# Patient Record
Sex: Female | Born: 1950 | Race: White | Hispanic: No | Marital: Married | Smoking: Former smoker
Health system: Southern US, Community
[De-identification: ages and names within clinical notes are randomized; demographics above are authoritative.]

## PROBLEM LIST (undated history)

## (undated) DIAGNOSIS — G8929 Other chronic pain: Secondary | ICD-10-CM

## (undated) DIAGNOSIS — R519 Headache, unspecified: Secondary | ICD-10-CM

## (undated) DIAGNOSIS — E785 Hyperlipidemia, unspecified: Secondary | ICD-10-CM

## (undated) DIAGNOSIS — F419 Anxiety disorder, unspecified: Secondary | ICD-10-CM

## (undated) DIAGNOSIS — G500 Trigeminal neuralgia: Secondary | ICD-10-CM

## (undated) DIAGNOSIS — K219 Gastro-esophageal reflux disease without esophagitis: Secondary | ICD-10-CM

## (undated) DIAGNOSIS — U071 COVID-19: Secondary | ICD-10-CM

## (undated) DIAGNOSIS — K2970 Gastritis, unspecified, without bleeding: Secondary | ICD-10-CM

## (undated) DIAGNOSIS — F32A Depression, unspecified: Secondary | ICD-10-CM

## (undated) DIAGNOSIS — M549 Dorsalgia, unspecified: Secondary | ICD-10-CM

## (undated) DIAGNOSIS — F329 Major depressive disorder, single episode, unspecified: Secondary | ICD-10-CM

## (undated) HISTORY — DX: Headache, unspecified: R51.9

## (undated) HISTORY — PX: BACK SURGERY: SHX140

## (undated) HISTORY — PX: OTHER SURGICAL HISTORY: SHX169

## (undated) HISTORY — DX: Headache, unspecified: G89.29

## (undated) HISTORY — PX: BUNIONECTOMY: SHX129

## (undated) HISTORY — DX: COVID-19: U07.1

## (undated) HISTORY — DX: Trigeminal neuralgia: G50.0

## (undated) HISTORY — PX: ABDOMINAL HYSTERECTOMY: SHX81

## (undated) HISTORY — PX: CATARACT EXTRACTION: SUR2

## (undated) HISTORY — DX: Gastro-esophageal reflux disease without esophagitis: K21.9

---

## 2015-03-24 ENCOUNTER — Other Ambulatory Visit: Payer: Self-pay | Admitting: Neurosurgery

## 2015-03-24 DIAGNOSIS — M5126 Other intervertebral disc displacement, lumbar region: Secondary | ICD-10-CM

## 2015-03-27 ENCOUNTER — Ambulatory Visit
Admission: RE | Admit: 2015-03-27 | Discharge: 2015-03-27 | Disposition: A | Discharge status: Home / Self Care | Payer: Managed Care, Other (non HMO) | Source: Ambulatory Visit | Attending: Neurosurgery | Admitting: Neurosurgery

## 2015-03-27 DIAGNOSIS — M5126 Other intervertebral disc displacement, lumbar region: Secondary | ICD-10-CM

## 2015-03-27 MED ORDER — GADOBENATE DIMEGLUMINE 529 MG/ML IV SOLN
15.0000 mL | Freq: Once | INTRAVENOUS | Status: AC | PRN
  Administered 2015-03-27: 15 mL via INTRAVENOUS

## 2015-03-28 ENCOUNTER — Other Ambulatory Visit: Payer: Self-pay

## 2015-04-17 ENCOUNTER — Other Ambulatory Visit: Payer: Self-pay | Admitting: Neurosurgery

## 2015-04-17 ENCOUNTER — Other Ambulatory Visit (HOSPITAL_COMMUNITY): Payer: Self-pay | Admitting: Neurosurgery

## 2015-04-17 DIAGNOSIS — M5126 Other intervertebral disc displacement, lumbar region: Secondary | ICD-10-CM

## 2015-05-03 ENCOUNTER — Ambulatory Visit (HOSPITAL_COMMUNITY): Payer: Managed Care, Other (non HMO)

## 2015-05-09 ENCOUNTER — Other Ambulatory Visit: Payer: Self-pay | Admitting: Neurosurgery

## 2015-05-09 DIAGNOSIS — M79605 Pain in left leg: Principal | ICD-10-CM

## 2015-05-09 DIAGNOSIS — M79604 Pain in right leg: Secondary | ICD-10-CM

## 2015-05-10 ENCOUNTER — Ambulatory Visit: Payer: Managed Care, Other (non HMO)

## 2015-05-10 DIAGNOSIS — M79604 Pain in right leg: Secondary | ICD-10-CM

## 2015-05-10 DIAGNOSIS — M79605 Pain in left leg: Principal | ICD-10-CM

## 2015-05-19 ENCOUNTER — Other Ambulatory Visit: Payer: Self-pay | Admitting: Pediatrics

## 2015-05-19 ENCOUNTER — Other Ambulatory Visit: Payer: Self-pay | Admitting: Neurosurgery

## 2015-05-19 DIAGNOSIS — M5126 Other intervertebral disc displacement, lumbar region: Secondary | ICD-10-CM

## 2015-05-29 ENCOUNTER — Ambulatory Visit
Admission: RE | Admit: 2015-05-29 | Discharge: 2015-05-29 | Disposition: A | Discharge status: Home / Self Care | Payer: Managed Care, Other (non HMO) | Source: Ambulatory Visit | Attending: Neurosurgery | Admitting: Neurosurgery

## 2015-05-29 DIAGNOSIS — M5126 Other intervertebral disc displacement, lumbar region: Secondary | ICD-10-CM

## 2015-05-29 MED ORDER — DIAZEPAM 5 MG PO TABS
10.0000 mg | ORAL_TABLET | Freq: Once | ORAL | Status: AC
  Administered 2015-05-29: 10 mg via ORAL

## 2015-05-29 MED ORDER — IOHEXOL 180 MG/ML  SOLN
15.0000 mL | Freq: Once | INTRAMUSCULAR | Status: AC | PRN
  Administered 2015-05-29: 15 mL via INTRATHECAL

## 2015-05-29 NOTE — Discharge Instructions (Signed)
Myelogram Discharge Instructions  1. Go home and rest quietly for the next 24 hours.  It is important to lie flat for the next 24 hours.  Get up only to go to the restroom.  You may lie in the bed or on a couch on your back, your stomach, your left side or your right side.  You may have one pillow under your head.  You may have pillows between your knees while you are on your side or under your knees while you are on your back.  2. DO NOT drive today.  Recline the seat as far back as it will go, while still wearing your seat belt, on the way home.  3. You may get up to go to the bathroom as needed.  You may sit up for 10 minutes to eat.  You may resume your normal diet and medications unless otherwise indicated.  Drink plenty of extra fluids today and tomorrow.  4. The incidence of a spinal headache with nausea and/or vomiting is about 5% (one in 20 patients).  If you develop a headache, lie flat and drink plenty of fluids until the headache goes away.  Caffeinated beverages may be helpful.  If you develop severe nausea and vomiting or a headache that does not go away with flat bed rest, call [phone].  5. You may resume normal activities after your 24 hours of bed rest is over; however, do not exert yourself strongly or do any heavy lifting tomorrow.  6. Call your physician for a follow-up appointment.   You may resume Fluoxetine on Tuesday, May 30, 2015 after 11:00a.m.

## 2015-06-07 ENCOUNTER — Other Ambulatory Visit: Payer: Self-pay | Admitting: Neurosurgery

## 2015-06-09 ENCOUNTER — Encounter (HOSPITAL_COMMUNITY): Payer: Self-pay | Admitting: *Deleted

## 2015-06-09 NOTE — Progress Notes (Signed)
Pt denies cardiac history, chest pain or sob. 

## 2015-06-12 ENCOUNTER — Ambulatory Visit (HOSPITAL_COMMUNITY): Payer: Managed Care, Other (non HMO)

## 2015-06-12 ENCOUNTER — Ambulatory Visit (HOSPITAL_COMMUNITY)
Admission: RE | Admit: 2015-06-12 | Discharge: 2015-06-12 | Disposition: A | Discharge status: Home / Self Care | Payer: Managed Care, Other (non HMO) | Source: Ambulatory Visit | Attending: Neurosurgery | Admitting: Neurosurgery

## 2015-06-12 ENCOUNTER — Ambulatory Visit (HOSPITAL_COMMUNITY): Payer: Managed Care, Other (non HMO) | Admitting: Certified Registered"

## 2015-06-12 ENCOUNTER — Encounter (HOSPITAL_COMMUNITY): Payer: Self-pay | Admitting: Surgery

## 2015-06-12 ENCOUNTER — Encounter (HOSPITAL_COMMUNITY)
Admission: RE | Disposition: A | Discharge status: Home / Self Care | Payer: Self-pay | Source: Ambulatory Visit | Attending: Neurosurgery

## 2015-06-12 DIAGNOSIS — Z87891 Personal history of nicotine dependence: Secondary | ICD-10-CM | POA: Insufficient documentation

## 2015-06-12 DIAGNOSIS — E785 Hyperlipidemia, unspecified: Secondary | ICD-10-CM | POA: Diagnosis not present

## 2015-06-12 DIAGNOSIS — M5126 Other intervertebral disc displacement, lumbar region: Secondary | ICD-10-CM | POA: Diagnosis not present

## 2015-06-12 DIAGNOSIS — M549 Dorsalgia, unspecified: Secondary | ICD-10-CM

## 2015-06-12 HISTORY — PX: LUMBAR LAMINECTOMY/DECOMPRESSION MICRODISCECTOMY: SHX5026

## 2015-06-12 HISTORY — DX: Anxiety disorder, unspecified: F41.9

## 2015-06-12 HISTORY — DX: Hyperlipidemia, unspecified: E78.5

## 2015-06-12 HISTORY — DX: Depression, unspecified: F32.A

## 2015-06-12 HISTORY — DX: Dorsalgia, unspecified: M54.9

## 2015-06-12 HISTORY — DX: Major depressive disorder, single episode, unspecified: F32.9

## 2015-06-12 HISTORY — DX: Other chronic pain: G89.29

## 2015-06-12 LAB — CBC
HEMATOCRIT: 38.2 % (ref 36.0–46.0)
Hemoglobin: 13.1 g/dL (ref 12.0–15.0)
MCH: 34.1 pg — AB (ref 26.0–34.0)
MCHC: 34.3 g/dL (ref 30.0–36.0)
MCV: 99.5 fL (ref 78.0–100.0)
Platelets: 212 10*3/uL (ref 150–400)
RBC: 3.84 MIL/uL — AB (ref 3.87–5.11)
RDW: 12.5 % (ref 11.5–15.5)
WBC: 5.7 10*3/uL (ref 4.0–10.5)

## 2015-06-12 LAB — SURGICAL PCR SCREEN
MRSA, PCR: NEGATIVE
STAPHYLOCOCCUS AUREUS: NEGATIVE

## 2015-06-12 SURGERY — LUMBAR LAMINECTOMY/DECOMPRESSION MICRODISCECTOMY 1 LEVEL
Anesthesia: General | Site: Back

## 2015-06-12 MED ORDER — ACETAMINOPHEN 10 MG/ML IV SOLN
INTRAVENOUS | Status: AC
  Administered 2015-06-12: 1000 mg via INTRAVENOUS
  Filled 2015-06-12: qty 100

## 2015-06-12 MED ORDER — METHYLPREDNISOLONE ACETATE 80 MG/ML IJ SUSP
INTRAMUSCULAR | Status: DC | PRN
  Administered 2015-06-12: 80 mg

## 2015-06-12 MED ORDER — BUPIVACAINE HCL 0.5 % IJ SOLN
INTRAMUSCULAR | Status: DC | PRN
  Administered 2015-06-12: 20 mL

## 2015-06-12 MED ORDER — LACTATED RINGERS IV SOLN
INTRAVENOUS | Status: DC
  Administered 2015-06-12 (×3): via INTRAVENOUS

## 2015-06-12 MED ORDER — MIDAZOLAM HCL 2 MG/2ML IJ SOLN
INTRAMUSCULAR | Status: AC
  Filled 2015-06-12: qty 4

## 2015-06-12 MED ORDER — FENTANYL CITRATE (PF) 250 MCG/5ML IJ SOLN
INTRAMUSCULAR | Status: AC
  Filled 2015-06-12: qty 5

## 2015-06-12 MED ORDER — CEFAZOLIN SODIUM 1-5 GM-% IV SOLN
1.0000 g | Freq: Three times a day (TID) | INTRAVENOUS | Status: DC
  Filled 2015-06-12 (×2): qty 50

## 2015-06-12 MED ORDER — HEMOSTATIC AGENTS (NO CHARGE) OPTIME
TOPICAL | Status: DC | PRN
  Administered 2015-06-12: 1 via TOPICAL

## 2015-06-12 MED ORDER — OXYCODONE-ACETAMINOPHEN 5-325 MG PO TABS: 1.0000 | ORAL_TABLET | ORAL | Status: DC | PRN

## 2015-06-12 MED ORDER — KETOROLAC TROMETHAMINE 30 MG/ML IJ SOLN: 30.0000 mg | Freq: Once | INTRAMUSCULAR | Status: DC | PRN

## 2015-06-12 MED ORDER — FENTANYL CITRATE (PF) 100 MCG/2ML IJ SOLN
INTRAMUSCULAR | Status: DC | PRN
  Administered 2015-06-12 (×4): 50 ug via INTRAVENOUS

## 2015-06-12 MED ORDER — DEXAMETHASONE SODIUM PHOSPHATE 10 MG/ML IJ SOLN
INTRAMUSCULAR | Status: DC | PRN
  Administered 2015-06-12: 10 mg via INTRAVENOUS

## 2015-06-12 MED ORDER — SUGAMMADEX SODIUM 200 MG/2ML IV SOLN
INTRAVENOUS | Status: DC | PRN
  Administered 2015-06-12: 160 mg via INTRAVENOUS

## 2015-06-12 MED ORDER — ACETAMINOPHEN 650 MG RE SUPP: 650.0000 mg | RECTAL | Status: DC | PRN

## 2015-06-12 MED ORDER — PROMETHAZINE HCL 25 MG/ML IJ SOLN: 6.2500 mg | INTRAMUSCULAR | Status: DC | PRN

## 2015-06-12 MED ORDER — HYDROMORPHONE HCL 1 MG/ML IJ SOLN: 0.2500 mg | INTRAMUSCULAR | Status: DC | PRN

## 2015-06-12 MED ORDER — SODIUM CHLORIDE 0.9 % IV SOLN: 250.0000 mL | INTRAVENOUS | Status: DC

## 2015-06-12 MED ORDER — KETOROLAC TROMETHAMINE 30 MG/ML IJ SOLN
30.0000 mg | Freq: Four times a day (QID) | INTRAMUSCULAR | Status: DC
  Filled 2015-06-12: qty 1

## 2015-06-12 MED ORDER — LIDOCAINE-EPINEPHRINE 0.5 %-1:200000 IJ SOLN
INTRAMUSCULAR | Status: DC | PRN
  Administered 2015-06-12: 10 mL

## 2015-06-12 MED ORDER — CEFAZOLIN SODIUM-DEXTROSE 2-3 GM-% IV SOLR
INTRAVENOUS | Status: AC
  Filled 2015-06-12: qty 50

## 2015-06-12 MED ORDER — SODIUM CHLORIDE 0.9 % IJ SOLN: 3.0000 mL | Freq: Two times a day (BID) | INTRAMUSCULAR | Status: DC

## 2015-06-12 MED ORDER — PHENYLEPHRINE HCL 10 MG/ML IJ SOLN
INTRAMUSCULAR | Status: DC | PRN
  Administered 2015-06-12: 40 ug via INTRAVENOUS
  Administered 2015-06-12: 80 ug via INTRAVENOUS
  Administered 2015-06-12 (×2): 40 ug via INTRAVENOUS
  Administered 2015-06-12: 80 ug via INTRAVENOUS
  Administered 2015-06-12 (×2): 40 ug via INTRAVENOUS

## 2015-06-12 MED ORDER — FLUOXETINE HCL 20 MG PO CAPS
40.0000 mg | ORAL_CAPSULE | Freq: Two times a day (BID) | ORAL | Status: DC
  Filled 2015-06-12: qty 2

## 2015-06-12 MED ORDER — DIAZEPAM 5 MG PO TABS: 5.0000 mg | ORAL_TABLET | Freq: Four times a day (QID) | ORAL | Status: DC | PRN

## 2015-06-12 MED ORDER — 0.9 % SODIUM CHLORIDE (POUR BTL) OPTIME
TOPICAL | Status: DC | PRN
  Administered 2015-06-12: 1000 mL

## 2015-06-12 MED ORDER — SUCCINYLCHOLINE CHLORIDE 20 MG/ML IJ SOLN
INTRAMUSCULAR | Status: AC
  Filled 2015-06-12: qty 1

## 2015-06-12 MED ORDER — FLUOXETINE HCL 40 MG PO CAPS: 40.0000 mg | ORAL_CAPSULE | Freq: Two times a day (BID) | ORAL | Status: DC

## 2015-06-12 MED ORDER — MENTHOL 3 MG MT LOZG
1.0000 | LOZENGE | OROMUCOSAL | Status: DC | PRN
Start: 2015-06-12 — End: 2015-06-12

## 2015-06-12 MED ORDER — SUGAMMADEX SODIUM 200 MG/2ML IV SOLN
INTRAVENOUS | Status: AC
  Filled 2015-06-12: qty 2

## 2015-06-12 MED ORDER — LORAZEPAM 0.5 MG PO TABS: 1.0000 mg | ORAL_TABLET | Freq: Two times a day (BID) | ORAL | Status: DC

## 2015-06-12 MED ORDER — FENTANYL CITRATE (PF) 100 MCG/2ML IJ SOLN
INTRAMUSCULAR | Status: DC | PRN
  Administered 2015-06-12: 200 ug via INTRAVENOUS

## 2015-06-12 MED ORDER — HYDROMORPHONE HCL 1 MG/ML IJ SOLN
0.5000 mg | INTRAMUSCULAR | Status: DC | PRN
  Administered 2015-06-12: 1 mg via INTRAVENOUS
  Filled 2015-06-12: qty 1

## 2015-06-12 MED ORDER — PROPOFOL 10 MG/ML IV BOLUS
INTRAVENOUS | Status: AC
  Filled 2015-06-12: qty 20

## 2015-06-12 MED ORDER — ONDANSETRON HCL 4 MG/2ML IJ SOLN
INTRAMUSCULAR | Status: DC | PRN
  Administered 2015-06-12: 4 mg via INTRAVENOUS

## 2015-06-12 MED ORDER — ROCURONIUM BROMIDE 100 MG/10ML IV SOLN
INTRAVENOUS | Status: DC | PRN
  Administered 2015-06-12: 40 mg via INTRAVENOUS

## 2015-06-12 MED ORDER — SODIUM CHLORIDE 0.9 % IJ SOLN: 3.0000 mL | INTRAMUSCULAR | Status: DC | PRN

## 2015-06-12 MED ORDER — ACETAMINOPHEN 325 MG PO TABS: 650.0000 mg | ORAL_TABLET | ORAL | Status: DC | PRN

## 2015-06-12 MED ORDER — CEFAZOLIN SODIUM-DEXTROSE 2-3 GM-% IV SOLR
2.0000 g | INTRAVENOUS | Status: AC
  Administered 2015-06-12: 2 g via INTRAVENOUS

## 2015-06-12 MED ORDER — FENTANYL CITRATE (PF) 100 MCG/2ML IJ SOLN
INTRAMUSCULAR | Status: AC
  Filled 2015-06-12: qty 2

## 2015-06-12 MED ORDER — PHENOL 1.4 % MT LIQD: 1.0000 | OROMUCOSAL | Status: DC | PRN

## 2015-06-12 MED ORDER — MIDAZOLAM HCL 5 MG/5ML IJ SOLN
INTRAMUSCULAR | Status: DC | PRN
  Administered 2015-06-12: 2 mg via INTRAVENOUS

## 2015-06-12 MED ORDER — LIDOCAINE HCL (CARDIAC) 20 MG/ML IV SOLN
INTRAVENOUS | Status: AC
  Filled 2015-06-12: qty 5

## 2015-06-12 MED ORDER — POTASSIUM CHLORIDE IN NACL 20-0.9 MEQ/L-% IV SOLN
INTRAVENOUS | Status: DC
  Filled 2015-06-12 (×2): qty 1000

## 2015-06-12 MED ORDER — ROCURONIUM BROMIDE 50 MG/5ML IV SOLN
INTRAVENOUS | Status: AC
  Filled 2015-06-12: qty 1

## 2015-06-12 MED ORDER — HYDROCODONE-ACETAMINOPHEN 5-325 MG PO TABS: 1.0000 | ORAL_TABLET | ORAL | Status: DC | PRN

## 2015-06-12 MED ORDER — SIMVASTATIN 20 MG PO TABS
20.0000 mg | ORAL_TABLET | Freq: Every day | ORAL | Status: DC
  Filled 2015-06-12: qty 1

## 2015-06-12 MED ORDER — THROMBIN 5000 UNITS EX SOLR
CUTANEOUS | Status: DC | PRN
  Administered 2015-06-12 (×2): 5000 [IU] via TOPICAL

## 2015-06-12 MED ORDER — MUPIROCIN 2 % EX OINT
1.0000 "application " | TOPICAL_OINTMENT | Freq: Once | CUTANEOUS | Status: AC
  Administered 2015-06-12: 1 via TOPICAL
  Filled 2015-06-12: qty 22

## 2015-06-12 MED ORDER — ONDANSETRON HCL 4 MG/2ML IJ SOLN: 4.0000 mg | INTRAMUSCULAR | Status: DC | PRN

## 2015-06-12 MED ORDER — ACETAMINOPHEN 10 MG/ML IV SOLN
1000.0000 mg | Freq: Once | INTRAVENOUS | Status: AC
  Administered 2015-06-12: 1000 mg via INTRAVENOUS

## 2015-06-12 MED ORDER — LIDOCAINE HCL (CARDIAC) 20 MG/ML IV SOLN
INTRAVENOUS | Status: DC | PRN
  Administered 2015-06-12: 50 mg via INTRAVENOUS

## 2015-06-12 MED ORDER — PROPOFOL 10 MG/ML IV BOLUS
INTRAVENOUS | Status: DC | PRN
  Administered 2015-06-12: 120 mg via INTRAVENOUS

## 2015-06-12 SURGICAL SUPPLY — 47 items
BAG DECANTER FOR FLEXI CONT (MISCELLANEOUS) ×3 IMPLANT
BENZOIN TINCTURE PRP APPL 2/3 (GAUZE/BANDAGES/DRESSINGS) IMPLANT
BLADE CLIPPER SURG (BLADE) IMPLANT
BUR MATCHSTICK NEURO 3.0 LAGG (BURR) ×3 IMPLANT
CANISTER SUCT 3000ML PPV (MISCELLANEOUS) ×3 IMPLANT
CLOSURE WOUND 1/2 X4 (GAUZE/BANDAGES/DRESSINGS)
DECANTER SPIKE VIAL GLASS SM (MISCELLANEOUS) ×3 IMPLANT
DRAPE LAPAROTOMY 100X72X124 (DRAPES) ×3 IMPLANT
DRAPE MICROSCOPE LEICA (MISCELLANEOUS) ×3 IMPLANT
DRAPE POUCH INSTRU U-SHP 10X18 (DRAPES) ×3 IMPLANT
DRAPE SURG 17X23 STRL (DRAPES) ×3 IMPLANT
DRSG OPSITE POSTOP 4X6 (GAUZE/BANDAGES/DRESSINGS) ×3 IMPLANT
DURAPREP 26ML APPLICATOR (WOUND CARE) ×3 IMPLANT
ELECT REM PT RETURN 9FT ADLT (ELECTROSURGICAL) ×3
ELECTRODE REM PT RTRN 9FT ADLT (ELECTROSURGICAL) ×1 IMPLANT
GAUZE SPONGE 4X4 12PLY STRL (GAUZE/BANDAGES/DRESSINGS) IMPLANT
GAUZE SPONGE 4X4 16PLY XRAY LF (GAUZE/BANDAGES/DRESSINGS) IMPLANT
GLOVE ECLIPSE 6.5 STRL STRAW (GLOVE) ×3 IMPLANT
GLOVE EXAM NITRILE LRG STRL (GLOVE) IMPLANT
GLOVE EXAM NITRILE MD LF STRL (GLOVE) IMPLANT
GLOVE EXAM NITRILE XL STR (GLOVE) IMPLANT
GLOVE EXAM NITRILE XS STR PU (GLOVE) IMPLANT
GOWN STRL REUS W/ TWL LRG LVL3 (GOWN DISPOSABLE) ×2 IMPLANT
GOWN STRL REUS W/ TWL XL LVL3 (GOWN DISPOSABLE) IMPLANT
GOWN STRL REUS W/TWL 2XL LVL3 (GOWN DISPOSABLE) IMPLANT
GOWN STRL REUS W/TWL LRG LVL3 (GOWN DISPOSABLE) ×4
GOWN STRL REUS W/TWL XL LVL3 (GOWN DISPOSABLE)
KIT BASIN OR (CUSTOM PROCEDURE TRAY) ×3 IMPLANT
KIT ROOM TURNOVER OR (KITS) ×3 IMPLANT
LIQUID BAND (GAUZE/BANDAGES/DRESSINGS) ×3 IMPLANT
NEEDLE HYPO 25X1 1.5 SAFETY (NEEDLE) ×3 IMPLANT
NEEDLE SPNL 18GX3.5 QUINCKE PK (NEEDLE) IMPLANT
NS IRRIG 1000ML POUR BTL (IV SOLUTION) ×3 IMPLANT
PACK LAMINECTOMY NEURO (CUSTOM PROCEDURE TRAY) ×3 IMPLANT
PAD ARMBOARD 7.5X6 YLW CONV (MISCELLANEOUS) ×9 IMPLANT
RUBBERBAND STERILE (MISCELLANEOUS) ×6 IMPLANT
SPONGE LAP 4X18 X RAY DECT (DISPOSABLE) IMPLANT
SPONGE SURGIFOAM ABS GEL SZ50 (HEMOSTASIS) ×3 IMPLANT
STRIP CLOSURE SKIN 1/2X4 (GAUZE/BANDAGES/DRESSINGS) IMPLANT
SUT VIC AB 0 CT1 18XCR BRD8 (SUTURE) ×1 IMPLANT
SUT VIC AB 0 CT1 8-18 (SUTURE) ×2
SUT VIC AB 2-0 CT1 18 (SUTURE) ×3 IMPLANT
SUT VIC AB 3-0 SH 8-18 (SUTURE) ×3 IMPLANT
SYR 20ML ECCENTRIC (SYRINGE) ×3 IMPLANT
TOWEL OR 17X24 6PK STRL BLUE (TOWEL DISPOSABLE) ×3 IMPLANT
TOWEL OR 17X26 10 PK STRL BLUE (TOWEL DISPOSABLE) ×3 IMPLANT
WATER STERILE IRR 1000ML POUR (IV SOLUTION) ×3 IMPLANT

## 2015-06-12 NOTE — Anesthesia Postprocedure Evaluation (Signed)
  Anesthesia Post-op Note  Patient: Teresa Hopkins  Procedure(s) Performed: Procedure(s) (LRB): redo L45 microdiskectomy (N/A)  Patient Location: PACU  Anesthesia Type: General  Level of Consciousness: awake and alert   Airway and Oxygen Therapy: Patient Spontanous Breathing  Post-op Pain: mild  Post-op Assessment: Post-op Vital signs reviewed, Patient's Cardiovascular Status Stable, Respiratory Function Stable, Patent Airway and No signs of Nausea or vomiting  Last Vitals:  Filed Vitals:   06/12/15 1215  BP: 112/56  Pulse:   Temp: 36.4 C  Resp:     Post-op Vital Signs: stable   Complications: No apparent anesthesia complications

## 2015-06-12 NOTE — H&P (Signed)
Teresa Hopkins is an 64 y.o. female.   Chief Complaint: right lower extremity pain status post lumbar discetomy right L5/S1 HPI: No pain relief after uncomplicated lumbar discetomy right L5/S1. Mri revealed and inflamed nerve root, and significant scar tissue surrounding right s1 root. Myelogram showed displacement of the thecal sac by soft tissue. Considering the amount of discomfort in the right lower extremity and the amount of soft tissue I consider it reasonable to explore the region, remove scar if present and decompress the neural elements.   Past Medical History  Diagnosis Date  . Anxiety   . Depression   . Hyperlipidemia   . Chronic back pain     Past Surgical History  Procedure Laterality Date  . Back surgery      microdiskectomy - 03/2015  . Fallopian tube and ovary surgery    . Bunionectomy Right     Family History  Problem Relation Age of Onset  . Alzheimer's disease Mother   . Heart attack Father    Social History:  reports that she quit smoking about 15 years ago. She has never used smokeless tobacco. She reports that she drinks alcohol. She reports that she does not use illicit drugs.  Allergies: No Known Allergies  Medications Prior to Admission  Medication Sig Dispense Refill  . FLUoxetine (PROZAC) 40 MG capsule Take 40 mg by mouth 2 (two) times daily.     Marland Kitchen LORazepam (ATIVAN) 1 MG tablet Take 1 mg by mouth 2 (two) times daily.     Marland Kitchen oxyCODONE-acetaminophen (PERCOCET/ROXICET) 5-325 MG per tablet Take 1 tablet by mouth every 4 (four) hours as needed for moderate pain or severe pain.     . simvastatin (ZOCOR) 20 MG tablet Take 20 mg by mouth daily at 6 PM.       Results for orders placed or performed during the hospital encounter of 06/12/15 (from the past 48 hour(s))  CBC     Status: Abnormal   Collection Time: 06/12/15  7:05 AM  Result Value Ref Range   WBC 5.7 4.0 - 10.5 K/uL   RBC 3.84 (L) 3.87 - 5.11 MIL/uL   Hemoglobin 13.1 12.0 - 15.0 g/dL   HCT 16.1  09.6 - 04.5 %   MCV 99.5 78.0 - 100.0 fL   MCH 34.1 (H) 26.0 - 34.0 pg   MCHC 34.3 30.0 - 36.0 g/dL   RDW 40.9 81.1 - 91.4 %   Platelets 212 150 - 400 K/uL   No results found.  Review of Systems  Constitutional: Negative.   HENT: Negative.   Eyes: Negative.   Respiratory: Negative.   Cardiovascular: Negative.   Gastrointestinal: Negative.   Genitourinary: Negative.   Musculoskeletal: Positive for back pain.  Skin: Negative.   Neurological: Negative.   Endo/Heme/Allergies: Negative.   Psychiatric/Behavioral: Negative.     Blood pressure 109/57, pulse 68, temperature 98.1 F (36.7 C), temperature source Oral, resp. rate 18, height 5\' 7"  (1.702 m), weight 77.111 kg (170 lb), SpO2 97 %. Physical Exam  Constitutional: She is oriented to person, place, and time. She appears well-developed and well-nourished.  HENT:  Head: Normocephalic and atraumatic.  Right Ear: External ear normal.  Left Ear: External ear normal.  Eyes: Conjunctivae and EOM are normal. Pupils are equal, round, and reactive to light.  Neck: Normal range of motion. Neck supple.  Cardiovascular: Normal rate, regular rhythm, normal heart sounds and intact distal pulses.   Respiratory: Effort normal and breath sounds normal.  GI: Soft. Bowel  sounds are normal.  Neurological: She is alert and oriented to person, place, and time. She has normal reflexes. She displays normal reflexes. No cranial nerve deficit. She exhibits normal muscle tone. Coordination normal.  Weakness in right lower extremity pain limited Numbness in right lower extremity  Skin: Skin is warm and dry.     Assessment/Plan OR for decompression L5/S1  Teresa Hopkins L 06/12/2015, 9:22 AM

## 2015-06-12 NOTE — Discharge Instructions (Signed)
Lumbar Discectomy °Care After °A discectomy involves removal of discmaterial (the cartilage-like structures located between the bones of the back). It is done to relieve pressure on nerve roots. It can be used as a treatment for a back problem. The time in surgery depends on the findings in surgery and what is necessary to correct the problems. °HOME CARE INSTRUCTIONS  °· Check the cut (incision) made by the surgeon twice a day for signs of infection. Some signs of infection may include:  °· A foul smelling, greenish or yellowish discharge from the wound.  °· Increased pain.  °· Increased redness over the incision (operative) site.  °· The skin edges may separate.  °· Flu-like symptoms (problems).  °· A temperature above 101.5° F (38.6° C).  °· Change your bandages in about 24 to 36 hours following surgery or as directed.  °· You may shower tomrrow.  Avoid bathtubs, swimming pools and hot tubs for three weeks or until your incision has healed completely. °· Follow your doctor's instructions as to safe activities, exercises, and physical therapy.  °· Weight reduction may be beneficial if you are overweight.  °· Daily exercise is helpful to prevent the return of problems. Walking is permitted. You may use a treadmill without an incline. Cut down on activities and exercise if you have discomfort. You may also go up and down stairs as much as you can tolerate.  °· DO NOT lift anything heavier than 10 to 15 lbs. Avoid bending or twisting at the waist. Always bend your knees when lifting.  °· Maintain strength and range of motion as instructed.  °· Do not drive for 10 days, or as directed by your doctors. You may be a passenger . Lying back in the passenger seat may be more comfortable for you. Always wear a seatbelt.  °· Limit your sitting in a regular chair to 20 to 30 minutes at a time. There are no limitations for sitting in a recliner. You should lie down or walk in between sitting periods.  °· Only take  over-the-counter or prescription medicines for pain, discomfort, or fever as directed by your caregiver.  °SEEK MEDICAL CARE IF:  °· There is increased bleeding (more than a small spot) from the wound.  °· You notice redness, swelling, or increasing pain in the wound.  °· Pus is coming from wound.  °· You develop an unexplained oral temperature above 102° F (38.9° C) develops.  °· You notice a foul smell coming from the wound or dressing.  °· You have increasing pain in your wound.  °SEEK IMMEDIATE MEDICAL CARE IF:  °· You develop a rash.  °· You have difficulty breathing.  °· You develop any allergic problems to medicines given.  °Document Released: 09/25/2004 Document Revised: 10/10/2011 Document Reviewed: 01/14/2008 °ExitCare® Patient Information °

## 2015-06-12 NOTE — Progress Notes (Signed)
Pt doing well. Pt and husband given D/C instructions with verbal understanding. Pt incision is clean and dry with no sign of infection. Pt's IV was removed prior to D/C. Pt D/C'd home via walking @ 1750 per MD order. Pt is stable @ D/C and has no other needs at this time. Rema Fendt, RN

## 2015-06-12 NOTE — Anesthesia Procedure Notes (Signed)
Procedure Name: Intubation Date/Time: 06/12/2015 9:37 AM Performed by: Dorie Rank Pre-anesthesia Checklist: Patient identified, Emergency Drugs available, Suction available, Patient being monitored and Timeout performed Patient Re-evaluated:Patient Re-evaluated prior to inductionOxygen Delivery Method: Circle system utilized Preoxygenation: Pre-oxygenation with 100% oxygen Intubation Type: IV induction Ventilation: Mask ventilation without difficulty Laryngoscope Size: Mac and 3 Grade View: Grade I Tube type: Oral Tube size: 7.0 mm Number of attempts: 1 Airway Equipment and Method: Stylet Placement Confirmation: ETT inserted through vocal cords under direct vision,  positive ETCO2 and breath sounds checked- equal and bilateral Secured at: 22 cm Tube secured with: Tape Dental Injury: Teeth and Oropharynx as per pre-operative assessment

## 2015-06-12 NOTE — Transfer of Care (Signed)
Immediate Anesthesia Transfer of Care Note  Patient: Teresa Hopkins  Procedure(s) Performed: Procedure(s) with comments: redo L45 microdiskectomy (N/A) - redo L45 microdiskectomy  Patient Location: PACU  Anesthesia Type:General  Level of Consciousness: awake, alert  and sedated  Airway & Oxygen Therapy: Patient connected to face mask oxygen  Post-op Assessment: Report given to RN  Post vital signs: stable  Last Vitals:  Filed Vitals:   06/12/15 0647  BP: 109/57  Pulse: 68  Temp: 36.7 C  Resp: 18    Complications: No apparent anesthesia complications

## 2015-06-12 NOTE — Discharge Summary (Signed)
  Physician Discharge Summary  Patient ID: Teresa Hopkins MRN: [id number] DOB/AGE: Mar 27, 1951 64 y.o.  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 female Whom was admitted to the hospital for an uncomplicated redo lumbar laminectomy at L4/5 on the right side. Her wound is clean, dry, without signs of infection. She is ambulating, voiding, and tolerating a regular diet.   Treatments: surgery: Redo laminectomy L4/5 for disc herniation, radiculopathy  Discharge Exam: Blood pressure 118/62, pulse 79, temperature 98 F (36.7 C), temperature source Oral, resp. rate 18, height  (1.702 m), weight 77.111 kg (170 lb), SpO2 98 %. General appearance: alert, cooperative and appears stated age Neurologic: Mental status: Alert, oriented, thought content appropriate Cranial nerves: normal Motor: strength is good, but has pain limited weakness in right lower extremity.   Disposition: Final discharge disposition not confirmed lumbar herniated disc    Medication List    TAKE these medications        FLUoxetine 40 MG capsule  Commonly known as:  PROZAC  Take 40 mg by mouth 2 (two) times daily.     LORazepam 1 MG tablet  Commonly known as:  ATIVAN  Take 1 mg by mouth 2 (two) times daily.     oxyCODONE-acetaminophen 5-325 MG per tablet  Commonly known as:  PERCOCET/ROXICET  Take 1 tablet by mouth every 4 (four) hours as needed for moderate pain or severe pain.     simvastatin 20 MG tablet  Commonly known as:  ZOCOR  Take 20 mg by mouth daily at 6 PM.           Follow-up Information    Follow up with Everlie Eble L, MD In 3 weeks.   Specialty:  Neurosurgery   Why:  call office to make an appointment   Contact information:   [address] [place] [address] [phone]       Signed: Carmela Hurt 06/12/2015, 5:27 PM

## 2015-06-12 NOTE — Op Note (Signed)
06/12/2015  11:54 AM  PATIENT:  Teresa Hopkins  65 y.o. female status post lumbar laminectomy L4/5 on the right approximately 3 months ago. She stated she had much more pain post op than preop. Repeat MRI did not show a recurrent disc herniation, but it did show a tremendous amount of scar tissue around the thecal sac, and the nerve root was inflamed. She and I decided to take a look at the nerve and thecal sac, along with decompressing it.   PRE-OPERATIVE DIAGNOSIS:  lumbar herniated disc L4/5  POST-OPERATIVE DIAGNOSIS:  lumbar herniated disc L4/5  PROCEDURE:  Procedure(s): redo L45 microdiskectomy  SURGEON:   Surgeon(s): Coletta Memos, MD Hilda Lias, MD  ASSISTANTS:Botero, Lynne Logan  ANESTHESIA:   general  EBL:  Total I/O In: 1600 [I.V.:1600] Out: -   BLOOD ADMINISTERED:none  CELL SAVER GIVEN:none  COUNT: per nursing  DRAINS: none   SPECIMEN:  No Specimen  DICTATION: [name] was taken to the operating room, intubated and placed under a general anesthetic without difficulty. She was positioned prone on a Wilson frame with all pressure points padded. Her back was prepped and draped in a sterile manner. I opened the previous incision with a 10 blade and carried the dissection down to the thoracolumbar fascia. I used both sharp dissection and the monopolar cautery to expose the lamina of 4, and 5. I confirmed my location with an intraoperative xray.  I used the drill, Kerrison punches, and curettes to perform a semihemilaminectomy of L4.I used curettes to free the scar tissue from the L4 lamina. I used the punches to remove the ligamentum flavum and scar tissue to expose the thecal sac. I brought the microscope into the operative field and with Dr.botero's assistance we started our decompression of the spinal canal, thecal sac and L5 root(s). I cauterized epidural veins overlying the disc space then divided them sharply. I opened the disc space with a penfield 4 dissector and  proceeded with the discectomy. I used pituitary rongeurs, curettes, and other instruments to remove disc material. After the discectomy was completed we inspected the L5 nerve root and felt it was well decompressed. I explored rostrally, laterally, medially, and caudally and was satisfied with the decompression. I irrigated the wound, then closed in layers. I approximated the thoracolumbar fascia, subcutaneous, and subcuticular planes with vicryl sutures. I used dermabond for a sterile dressing.   PLAN OF CARE: Admit for overnight observation  PATIENT DISPOSITION:  PACU - hemodynamically stable.   Delay start of Pharmacological VTE agent (>24hrs) due to surgical blood loss or risk of bleeding:  yes

## 2015-06-12 NOTE — Progress Notes (Signed)
Patient voiced complaint of having a headache and rated it as "8" out of 10. Nurse called Dr. Okey Dupre (Anesthesia) and informed him of this. Dr. Okey Dupre ordered for patient to have 1g of IV Tylenol and to run it over 15 minutes. Will enter orders and administer.

## 2015-06-12 NOTE — Anesthesia Preprocedure Evaluation (Addendum)
Anesthesia Evaluation  Patient identified by MRN, date of birth, ID band Patient awake    Reviewed: Allergy & Precautions, NPO status , Patient's Chart, lab work & pertinent test results  Airway Mallampati: II  TM Distance: >3 FB Neck ROM: Full    Dental no notable dental hx. (+) Teeth Intact, Dental Advisory Given,    Pulmonary neg pulmonary ROS, former smoker,  breath sounds clear to auscultation  Pulmonary exam normal       Cardiovascular negative cardio ROS Normal cardiovascular examRhythm:Regular Rate:Normal     Neuro/Psych Anxiety negative neurological ROS     GI/Hepatic negative GI ROS, Neg liver ROS,   Endo/Other  negative endocrine ROS  Renal/GU negative Renal ROS  negative genitourinary   Musculoskeletal negative musculoskeletal ROS (+)   Abdominal (+)  Abdomen: soft. Bowel sounds: normal.  Peds negative pediatric ROS (+)  Hematology negative hematology ROS (+)   Anesthesia Other Findings   Reproductive/Obstetrics negative OB ROS                           Anesthesia Physical Anesthesia Plan  ASA: II  Anesthesia Plan: General   Post-op Pain Management:    Induction: Intravenous  Airway Management Planned: Oral ETT  Additional Equipment:   Intra-op Plan:   Post-operative Plan: Extubation in OR  Informed Consent: I have reviewed the patients History and Physical, chart, labs and discussed the procedure including the risks, benefits and alternatives for the proposed anesthesia with the patient or authorized representative who has indicated his/her understanding and acceptance.   Dental advisory given  Plan Discussed with: CRNA and Surgeon  Anesthesia Plan Comments:         Anesthesia Quick Evaluation

## 2015-06-13 ENCOUNTER — Encounter (HOSPITAL_COMMUNITY): Payer: Self-pay | Admitting: Neurosurgery

## 2015-07-25 ENCOUNTER — Other Ambulatory Visit: Payer: Self-pay | Admitting: Neurosurgery

## 2015-07-25 DIAGNOSIS — R1031 Right lower quadrant pain: Secondary | ICD-10-CM

## 2015-08-02 ENCOUNTER — Ambulatory Visit
Admission: RE | Admit: 2015-08-02 | Discharge: 2015-08-02 | Disposition: A | Discharge status: Home / Self Care | Payer: Managed Care, Other (non HMO) | Source: Ambulatory Visit | Attending: Neurosurgery | Admitting: Neurosurgery

## 2015-08-02 DIAGNOSIS — R1031 Right lower quadrant pain: Secondary | ICD-10-CM

## 2015-08-02 MED ORDER — IOPAMIDOL (ISOVUE-300) INJECTION 61%
100.0000 mL | Freq: Once | INTRAVENOUS | Status: AC | PRN
  Administered 2015-08-02: 100 mL via INTRAVENOUS

## 2015-09-20 ENCOUNTER — Other Ambulatory Visit: Payer: Self-pay | Admitting: General Surgery

## 2015-09-20 DIAGNOSIS — R1903 Right lower quadrant abdominal swelling, mass and lump: Secondary | ICD-10-CM

## 2015-10-03 ENCOUNTER — Ambulatory Visit
Admission: RE | Admit: 2015-10-03 | Discharge: 2015-10-03 | Disposition: A | Discharge status: Home / Self Care | Payer: Managed Care, Other (non HMO) | Source: Ambulatory Visit | Attending: General Surgery | Admitting: General Surgery

## 2015-10-03 DIAGNOSIS — R1903 Right lower quadrant abdominal swelling, mass and lump: Secondary | ICD-10-CM

## 2015-10-03 MED ORDER — IOPAMIDOL (ISOVUE-300) INJECTION 61%
100.0000 mL | Freq: Once | INTRAVENOUS | Status: AC | PRN
  Administered 2015-10-03: 100 mL via INTRAVENOUS

## 2016-01-25 ENCOUNTER — Other Ambulatory Visit: Payer: Self-pay | Admitting: Family Medicine

## 2016-01-25 DIAGNOSIS — R1030 Lower abdominal pain, unspecified: Secondary | ICD-10-CM

## 2016-02-02 ENCOUNTER — Ambulatory Visit
Admission: RE | Admit: 2016-02-02 | Discharge: 2016-02-02 | Disposition: A | Discharge status: Home / Self Care | Payer: Self-pay | Source: Ambulatory Visit | Attending: Family Medicine | Admitting: Family Medicine

## 2016-02-02 DIAGNOSIS — R1030 Lower abdominal pain, unspecified: Secondary | ICD-10-CM

## 2016-02-02 MED ORDER — GADOBENATE DIMEGLUMINE 529 MG/ML IV SOLN
16.0000 mL | Freq: Once | INTRAVENOUS | Status: AC | PRN
  Administered 2016-02-02: 16 mL via INTRAVENOUS

## 2016-05-17 DIAGNOSIS — R109 Unspecified abdominal pain: Secondary | ICD-10-CM | POA: Diagnosis not present

## 2016-05-17 DIAGNOSIS — F411 Generalized anxiety disorder: Secondary | ICD-10-CM | POA: Diagnosis not present

## 2016-06-13 DIAGNOSIS — J329 Chronic sinusitis, unspecified: Secondary | ICD-10-CM | POA: Diagnosis not present

## 2016-06-13 DIAGNOSIS — K2990 Gastroduodenitis, unspecified, without bleeding: Secondary | ICD-10-CM | POA: Diagnosis not present

## 2016-06-21 ENCOUNTER — Emergency Department (HOSPITAL_COMMUNITY)
Admission: EM | Admit: 2016-06-21 | Discharge: 2016-06-21 | Disposition: A | Discharge status: Home / Self Care | Payer: Medicare Other | Attending: Emergency Medicine | Admitting: Emergency Medicine

## 2016-06-21 ENCOUNTER — Encounter (HOSPITAL_COMMUNITY): Payer: Self-pay | Admitting: *Deleted

## 2016-06-21 ENCOUNTER — Emergency Department (HOSPITAL_COMMUNITY): Payer: Medicare Other

## 2016-06-21 DIAGNOSIS — R519 Headache, unspecified: Secondary | ICD-10-CM

## 2016-06-21 DIAGNOSIS — Z87891 Personal history of nicotine dependence: Secondary | ICD-10-CM | POA: Diagnosis not present

## 2016-06-21 DIAGNOSIS — R51 Headache: Secondary | ICD-10-CM

## 2016-06-21 DIAGNOSIS — J3489 Other specified disorders of nose and nasal sinuses: Secondary | ICD-10-CM

## 2016-06-21 DIAGNOSIS — J321 Chronic frontal sinusitis: Secondary | ICD-10-CM | POA: Diagnosis not present

## 2016-06-21 DIAGNOSIS — R0981 Nasal congestion: Secondary | ICD-10-CM | POA: Diagnosis not present

## 2016-06-21 HISTORY — DX: Gastritis, unspecified, without bleeding: K29.70

## 2016-06-21 LAB — COMPREHENSIVE METABOLIC PANEL
ALBUMIN: 4 g/dL (ref 3.5–5.0)
ALT: 22 U/L (ref 14–54)
AST: 24 U/L (ref 15–41)
Alkaline Phosphatase: 56 U/L (ref 38–126)
Anion gap: 8 (ref 5–15)
BUN: 19 mg/dL (ref 6–20)
CHLORIDE: 108 mmol/L (ref 101–111)
CO2: 24 mmol/L (ref 22–32)
CREATININE: 0.85 mg/dL (ref 0.44–1.00)
Calcium: 9.4 mg/dL (ref 8.9–10.3)
GFR calc Af Amer: >60 mL/min (ref >60)
Glucose, Bld: 102 mg/dL — ABNORMAL HIGH (ref 65–99)
POTASSIUM: 3.6 mmol/L (ref 3.5–5.1)
SODIUM: 140 mmol/L (ref 135–145)
Total Bilirubin: 0.5 mg/dL (ref 0.3–1.2)
Total Protein: 7 g/dL (ref 6.5–8.1)

## 2016-06-21 LAB — URINALYSIS, ROUTINE W REFLEX MICROSCOPIC
Glucose, UA: NEGATIVE mg/dL
HGB URINE DIPSTICK: NEGATIVE
Ketones, ur: 15 mg/dL — AB
LEUKOCYTES UA: NEGATIVE
Nitrite: NEGATIVE
PROTEIN: NEGATIVE mg/dL
Specific Gravity, Urine: 1.038 — ABNORMAL HIGH (ref 1.005–1.030)
pH: 5.5 (ref 5.0–8.0)

## 2016-06-21 LAB — CBC
HEMATOCRIT: 39.6 % (ref 36.0–46.0)
Hemoglobin: 13.4 g/dL (ref 12.0–15.0)
MCH: 33.8 pg (ref 26.0–34.0)
MCHC: 33.8 g/dL (ref 30.0–36.0)
MCV: 100 fL (ref 78.0–100.0)
PLATELETS: 235 10*3/uL (ref 150–400)
RBC: 3.96 MIL/uL (ref 3.87–5.11)
RDW: 12 % (ref 11.5–15.5)
WBC: 5.9 10*3/uL (ref 4.0–10.5)

## 2016-06-21 LAB — LIPASE, BLOOD: LIPASE: 29 U/L (ref 11–51)

## 2016-06-21 MED ORDER — SODIUM CHLORIDE 0.9 % IV BOLUS (SEPSIS)
1000.0000 mL | Freq: Once | INTRAVENOUS | Status: AC
  Administered 2016-06-21: 1000 mL via INTRAVENOUS

## 2016-06-21 MED ORDER — DIPHENHYDRAMINE HCL 50 MG/ML IJ SOLN
25.0000 mg | Freq: Once | INTRAMUSCULAR | Status: AC
  Administered 2016-06-21: 25 mg via INTRAVENOUS
  Filled 2016-06-21: qty 1

## 2016-06-21 MED ORDER — METOCLOPRAMIDE HCL 5 MG/ML IJ SOLN
10.0000 mg | Freq: Once | INTRAMUSCULAR | Status: AC
  Administered 2016-06-21: 10 mg via INTRAVENOUS
  Filled 2016-06-21: qty 2

## 2016-06-21 MED ORDER — GI COCKTAIL ~~LOC~~
30.0000 mL | Freq: Once | ORAL | Status: AC
  Administered 2016-06-21: 30 mL via ORAL
  Filled 2016-06-21: qty 30

## 2016-06-21 MED ORDER — METHYLPREDNISOLONE SODIUM SUCC 125 MG IJ SOLR
125.0000 mg | Freq: Once | INTRAMUSCULAR | Status: AC
  Administered 2016-06-21: 125 mg via INTRAVENOUS
  Filled 2016-06-21: qty 2

## 2016-06-21 MED ORDER — SUCRALFATE 1 G PO TABS: 1.0000 g | ORAL_TABLET | Freq: Three times a day (TID) | ORAL | 1 refills | Status: DC

## 2016-06-21 MED ORDER — RANITIDINE HCL 150 MG PO TABS: 150.0000 mg | ORAL_TABLET | Freq: Two times a day (BID) | ORAL | 0 refills | Status: DC

## 2016-06-21 NOTE — ED Notes (Signed)
MD at bedside to discuss disposition 

## 2016-06-21 NOTE — ED Notes (Signed)
Patient transported to CT 

## 2016-06-21 NOTE — ED Triage Notes (Signed)
Pt became ill on cruise to [place] 3 weeks ago.  She was tx for fever with tamaflu.  Pt continued to have sinus congestion and headaches so started Augmentin last Wed with no improvements.  Was started on Doxy yesterday but was unable to tolerate d/t abdominal pain.

## 2016-06-21 NOTE — ED Provider Notes (Signed)
[facility]EMER[facility]ENCY DEPT Provider Note   CSN: [id number] Arrival date & time: 06/21/16  1247     History   Chief Complaint Chief Complaint  Patient presents with  . Headache    HPI Teresa Hopkins is a 65 y.o. female.  The history is provided by the patient.  Migraine  This is a new problem. The current episode started more than 1 week ago. The problem occurs constantly. The problem has not changed since onset.Associated symptoms include abdominal pain. Pertinent negatives include no chest pain, no headaches and no shortness of breath. Nothing aggravates the symptoms. Nothing relieves the symptoms. She has tried nothing for the symptoms.    Past Medical History:  Diagnosis Date  . Anxiety   . Chronic back pain   . Depression   . Gastritis   . Hyperlipidemia     Patient Active Problem List   Diagnosis Date Noted  . HNP (herniated nucleus pulposus), lumbar 06/12/2015    Past Surgical History:  Procedure Laterality Date  . BACK SURGERY     microdiskectomy - 03/2015  . BUNIONECTOMY Right   . fallopian tube and ovary surgery    . LUMBAR LAMINECTOMY/DECOMPRESSION MICRODISCECTOMY N/A 06/12/2015   Procedure: redo L45 microdiskectomy;  Surgeon: Coletta MemosKyle Cabbell, MD;  Location: [facility];  Service: Neurosurgery;  Laterality: N/A;  redo L45 microdiskectomy    OB History    No data available       Home Medications    Prior to Admission medications   Medication Sig Start Date End Date Taking? Authorizing Provider  FLUoxetine (PROZAC) 40 MG capsule Take 40 mg by mouth 2 (two) times daily.  10/31/14  Yes Historical Provider, MD  hyoscyamine (ANASPAZ) 0.125 MG TBDP disintergrating tablet Place 0.125 mg under the tongue every 6 (six) hours as needed for bladder spasms.   Yes Historical Provider, MD  LORazepam (ATIVAN) 1 MG tablet Take 1 mg by mouth 2 (two) times daily.  10/24/14  Yes Historical Provider, MD  pantoprazole (PROTONIX) 40 MG tablet Take 40 mg by mouth daily.   Yes  Historical Provider, MD  simvastatin (ZOCOR) 20 MG tablet Take 20 mg by mouth daily at 6 PM.  10/31/14  Yes Historical Provider, MD  ranitidine (ZANTAC) 150 MG tablet Take 1 tablet (150 mg total) by mouth 2 (two) times daily. 06/21/16   Marily MemosJason Shuree Brossart, MD  sucralfate (CARAFATE) 1 g tablet Take 1 tablet (1 g total) by mouth 4 (four) times daily -  with meals and at bedtime. 06/21/16   Marily MemosJason Cyrene Gharibian, MD    Family History Family History  Problem Relation Age of Onset  . Alzheimer's disease Mother   . Heart attack Father     Social History Social History  Substance Use Topics  . Smoking status: Former Smoker    Quit date: 06/08/2000  . Smokeless tobacco: Never Used  . Alcohol use Yes     Comment: occasional     Allergies   Review of patient's allergies indicates no known allergies.   Review of Systems Review of Systems  Respiratory: Negative for shortness of breath.   Cardiovascular: Negative for chest pain.  Gastrointestinal: Positive for abdominal pain and nausea.  Neurological: Negative for headaches.  All other systems reviewed and are negative.    Physical Exam Updated Vital Signs BP 106/65   Pulse 72   Temp 98.5 F (36.9 C) (Oral)   Resp 16   Ht 5\' 7"  (1.702 m)   Wt 170 lb (77.1 kg)  SpO2 96%   BMI 26.63 kg/m   Physical Exam  Constitutional: She appears well-developed and well-nourished. No distress.  HENT:  Head: Normocephalic and atraumatic.  Eyes: Conjunctivae are normal.  Neck: Neck supple.  Cardiovascular: Normal rate and regular rhythm.   No murmur heard. Pulmonary/Chest: Effort normal and breath sounds normal. No respiratory distress.  Abdominal: Soft. There is no tenderness.  Musculoskeletal: She exhibits no edema.  Neurological: She is alert.  Skin: Skin is warm and dry.  Psychiatric: She has a normal mood and affect.  Nursing note and vitals reviewed.    ED Treatments / Results  Labs (all labs ordered are listed, but only abnormal results  are displayed) Labs Reviewed  COMPREHENSIVE METABOLIC PANEL - Abnormal; Notable for the following:       Result Value   Glucose, Bld 102 (*)    All other components within normal limits  URINALYSIS, ROUTINE W REFLEX MICROSCOPIC (NOT AT [facility]) - Abnormal; Notable for the following:    Color, Urine AMBER (*)    Specific Gravity, Urine 1.038 (*)    Bilirubin Urine SMALL (*)    Ketones, ur 15 (*)    All other components within normal limits  LIPASE, BLOOD  CBC    EKG  EKG Interpretation None       Radiology Ct Head Wo Contrast  Result Date: 06/21/2016 CLINICAL DATA:  Subacute onset of headache and sinus congestion. Initial encounter. EXAM: CT HEAD WITHOUT CONTRAST CT MAXILLOFACIAL WITHOUT CONTRAST TECHNIQUE: Multidetector CT imaging of the head and maxillofacial structures were performed using the standard protocol without intravenous contrast. Multiplanar CT image reconstructions of the maxillofacial structures were also generated. COMPARISON:  None. FINDINGS: CT HEAD FINDINGS There is no evidence of acute infarction, mass lesion, or intra- or extra-axial hemorrhage on CT. The posterior fossa, including the cerebellum, brainstem and fourth ventricle, is within normal limits. The third and lateral ventricles, and basal ganglia are unremarkable in appearance. The cerebral hemispheres are symmetric in appearance, with normal gray-white differentiation. No mass effect or midline shift is seen. There is no evidence of fracture; visualized osseous structures are unremarkable in appearance. The orbits are within normal limits. The paranasal sinuses and mastoid air cells are well-aerated. No significant soft tissue abnormalities are seen. CT MAXILLOFACIAL FINDINGS There is no evidence of fracture or dislocation. The maxilla and mandible appear intact. The nasal bone is unremarkable in appearance. The visualized dentition demonstrates no acute abnormality. The orbits are intact bilaterally. The  visualized paranasal sinuses and mastoid air cells are well-aerated. No mass is identified. No significant soft tissue abnormalities are seen. The parapharyngeal fat planes are preserved. The nasopharynx, oropharynx and hypopharynx are unremarkable in appearance. The visualized portions of the valleculae and piriform sinuses are grossly unremarkable. The parotid and submandibular glands are within normal limits. No cervical lymphadenopathy is seen. IMPRESSION: 1. No acute intracranial pathology seen on CT. 2. No evidence of mass. The paranasal sinuses and mastoid air cells are well-aerated. 3. No evidence of fracture or dislocation with regard to the maxillofacial structures. Electronically Signed   By: Roanna Raider M.D.   On: 06/21/2016 19:43   Ct Maxillofacial Wo Contrast  Result Date: 06/21/2016 CLINICAL DATA:  Subacute onset of headache and sinus congestion. Initial encounter. EXAM: CT HEAD WITHOUT CONTRAST CT MAXILLOFACIAL WITHOUT CONTRAST TECHNIQUE: Multidetector CT imaging of the head and maxillofacial structures were performed using the standard protocol without intravenous contrast. Multiplanar CT image reconstructions of the maxillofacial structures were also generated. COMPARISON:  None. FINDINGS: CT HEAD FINDINGS There is no evidence of acute infarction, mass lesion, or intra- or extra-axial hemorrhage on CT. The posterior fossa, including the cerebellum, brainstem and fourth ventricle, is within normal limits. The third and lateral ventricles, and basal ganglia are unremarkable in appearance. The cerebral hemispheres are symmetric in appearance, with normal gray-white differentiation. No mass effect or midline shift is seen. There is no evidence of fracture; visualized osseous structures are unremarkable in appearance. The orbits are within normal limits. The paranasal sinuses and mastoid air cells are well-aerated. No significant soft tissue abnormalities are seen. CT MAXILLOFACIAL FINDINGS There  is no evidence of fracture or dislocation. The maxilla and mandible appear intact. The nasal bone is unremarkable in appearance. The visualized dentition demonstrates no acute abnormality. The orbits are intact bilaterally. The visualized paranasal sinuses and mastoid air cells are well-aerated. No mass is identified. No significant soft tissue abnormalities are seen. The parapharyngeal fat planes are preserved. The nasopharynx, oropharynx and hypopharynx are unremarkable in appearance. The visualized portions of the valleculae and piriform sinuses are grossly unremarkable. The parotid and submandibular glands are within normal limits. No cervical lymphadenopathy is seen. IMPRESSION: 1. No acute intracranial pathology seen on CT. 2. No evidence of mass. The paranasal sinuses and mastoid air cells are well-aerated. 3. No evidence of fracture or dislocation with regard to the maxillofacial structures. Electronically Signed   By: Roanna RaiderJeffery  Chang M.D.   On: 06/21/2016 19:43    Procedures Procedures (including critical care time)  Medications Ordered in ED Medications  gi cocktail (Maalox,Lidocaine,Donnatal) (30 mLs Oral Given 06/21/16 1943)  metoCLOPramide (REGLAN) injection 10 mg (10 mg Intravenous Given 06/21/16 1944)  methylPREDNISolone sodium succinate (SOLU-MEDROL) 125 mg/2 mL injection 125 mg (125 mg Intravenous Given 06/21/16 1944)  sodium chloride 0.9 % bolus 1,000 mL (0 mLs Intravenous Stopped 06/21/16 2037)  diphenhydrAMINE (BENADRYL) injection 25 mg (25 mg Intravenous Given 06/21/16 1944)     Initial Impression / Assessment and Plan / ED Course  I have reviewed the triage vital signs and the nursing notes.  Pertinent labs & imaging results that were available during my care of the patient were reviewed by me and considered in my medical decision making (see chart for details).  Clinical Course   Patient here with what she felt was sinusitis causing headaches over the 30 minutes. 2 courses of  antibiotics and was starting her third but could not continue a secondary to an upset stomach. He says the headache feels the same as previous sinus headaches however not this long usually. Neuro exam was fine abdominal exam was also normal. Treat her headache and a headache cocktail and headache resolved. CT head face without any evidence sinusitis so we'll just continue taking antibiotics. She'll also follow up with a neurologist for further management of her headaches.    Final Clinical Impressions(s) / ED Diagnoses   Final diagnoses:  Frontal sinus pain  Nonintractable headache, unspecified chronicity pattern, unspecified headache type    New Prescriptions Discharge Medication List as of 06/21/2016  9:08 PM    START taking these medications   Details  ranitidine (ZANTAC) 150 MG tablet Take 1 tablet (150 mg total) by mouth 2 (two) times daily., Starting Fri 06/21/2016, Print    sucralfate (CARAFATE) 1 g tablet Take 1 tablet (1 g total) by mouth 4 (four) times daily -  with meals and at bedtime., Starting Fri 06/21/2016, Print         Marily MemosJason Lakashia Collison, MD 06/21/16  2351  

## 2016-06-21 NOTE — ED Notes (Signed)
Patient comes in with a severe h/a. Patient had been seen outpatient for a sinus infection that has lasted for the last 3 weeks. Had the flu prior to the sinus infection. Given Augmentin and felt it was not improving symptoms so took med for 6 out of the 10 days and then stopped taking it and was switched doxycycline yesterday. Doxycycline caused severe nausea and vomiting after first dose. Patient takes tylenol and states it helps a little for the pain. Denies hx of migraines and last sinus infection was years.

## 2016-08-13 IMAGING — MR MR LUMBAR SPINE WO/W CM
7 series · 44 of 48 positions shown · IV contrast (15ml multihance)
Comparison: None.

CLINICAL DATA: Right calf pain, right groin pain. Extends down the
right leg.

EXAM:
MRI LUMBAR SPINE WITHOUT AND WITH CONTRAST
TECHNIQUE: Multiplanar and multiecho pulse sequences of the lumbar spine were
obtained without and with intravenous contrast.
CONTRAST:  15mL MULTIHANCE GADOBENATE DIMEGLUMINE 529 MG/ML IV SOLN

[Series 3: tirm sag · sagittal · 4.0mm · 0.55mm/px · 5 of 13 slices shown]
[im 1/13]
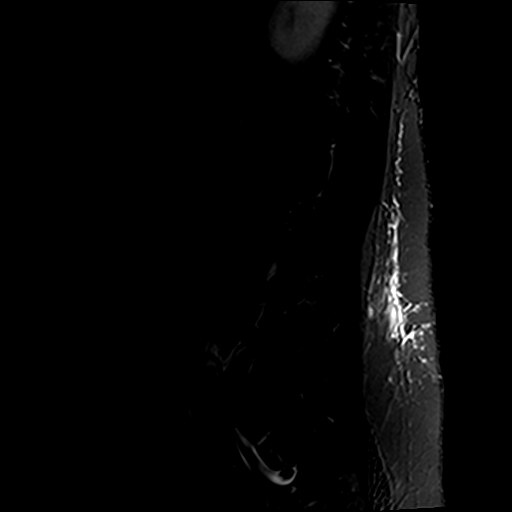
[im 4/13]
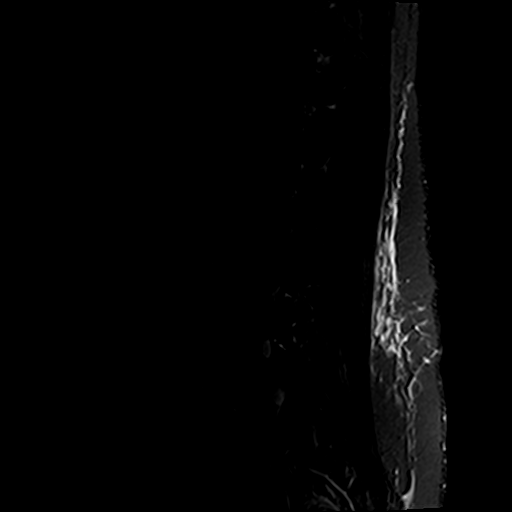
[im 7/13]
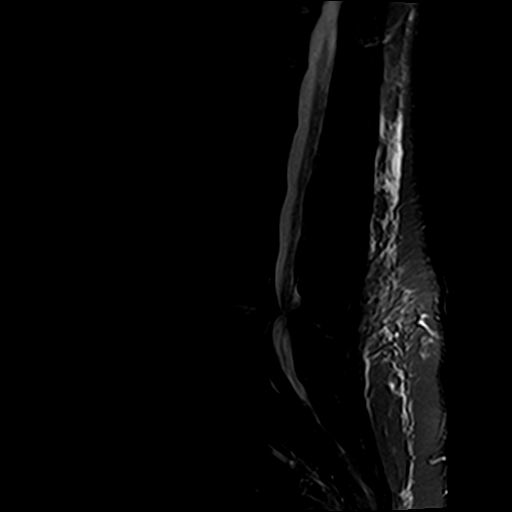
[im 10/13]
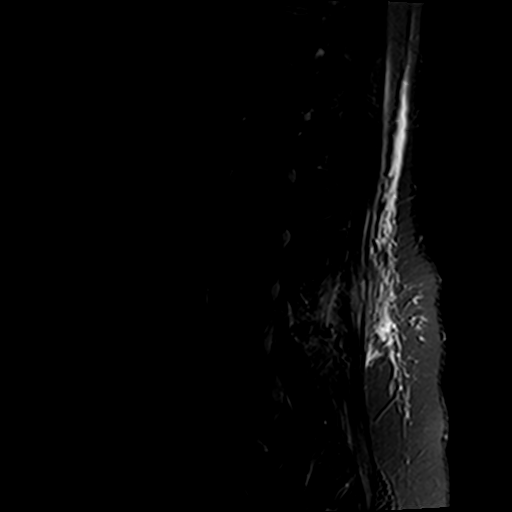
[im 13/13]
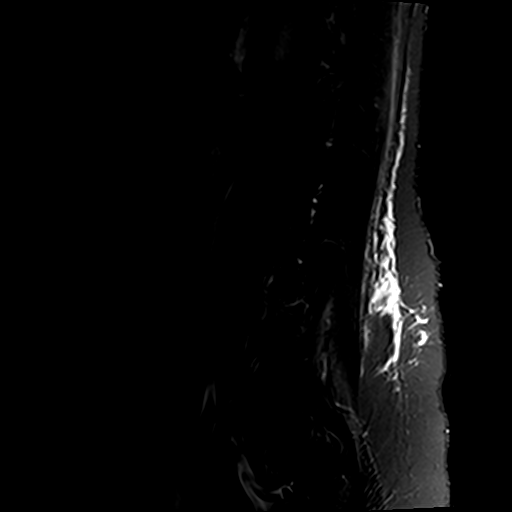

[Series 4: T1 · sagittal · 4.0mm · 0.88mm/px · 5 of 13 slices shown (1 of 2)]
[im 1/13]
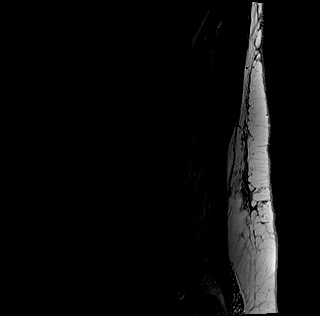
[im 4/13]
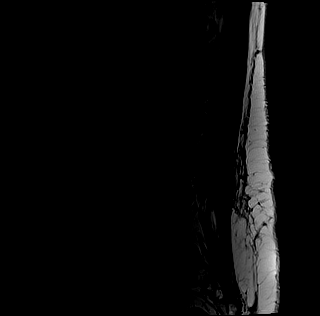
[im 7/13]
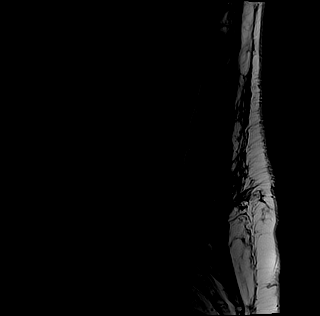
[im 10/13]
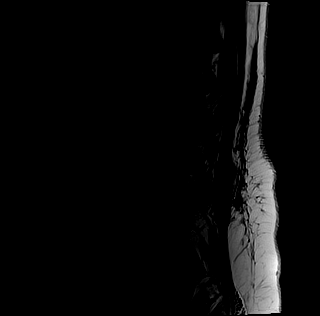
[im 13/13]
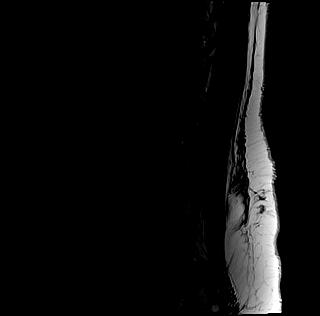

[Series 5: T1 · axial · 4.0mm · 0.74mm/px · z∈[-61,+104]mm · 8 of 33 slices shown (2 of 2)]
[im 1/33]
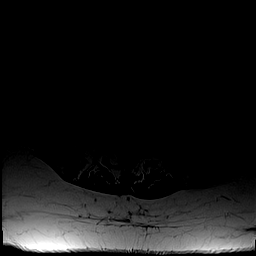
[im 4/33]
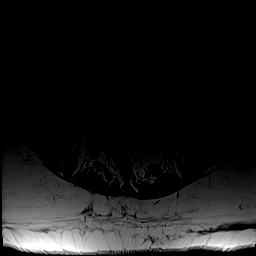
[im 11/33]
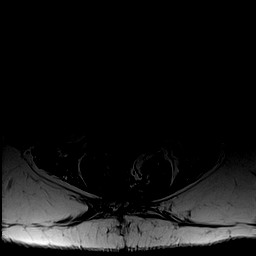
[im 15/33]
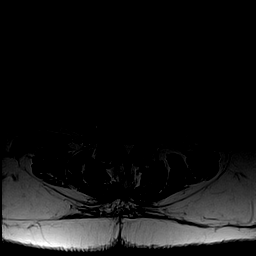
[im 18/33]
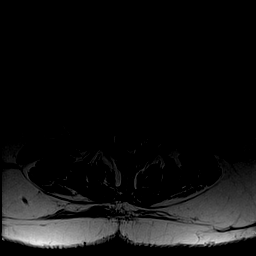
[im 22/33]
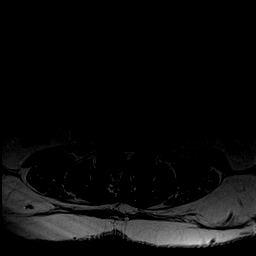
[im 29/33]
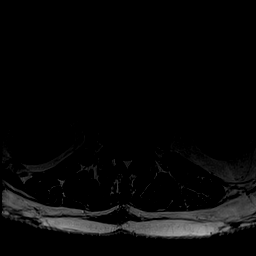
[im 33/33]
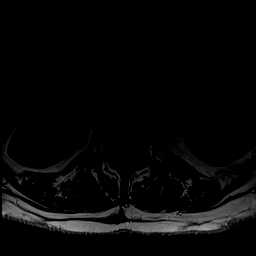

[Series 6: T2 · axial · 4.0mm · 0.74mm/px · z∈[-61,+104]mm · 10 of 33 slices shown (1 of 2)]
[im 1/33]
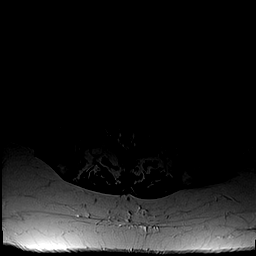
[im 4/33]
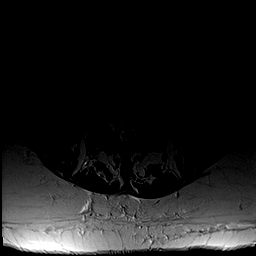
[im 8/33]
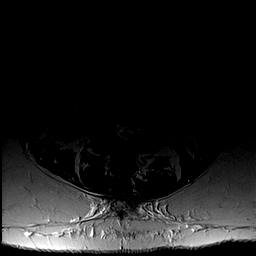
[im 11/33]
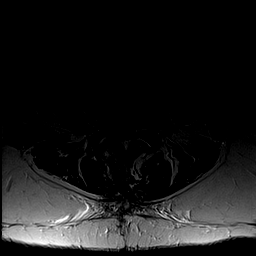
[im 15/33]
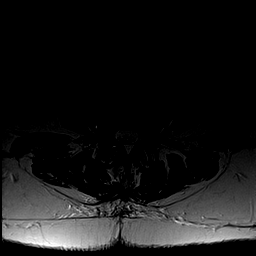
[im 18/33]
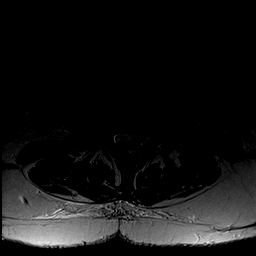
[im 22/33]
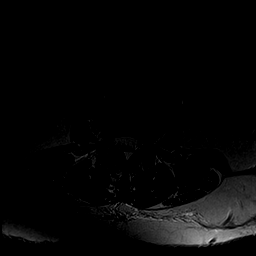
[im 25/33]
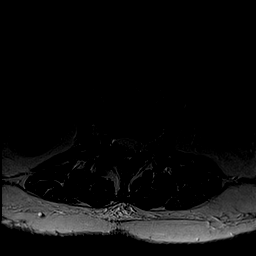
[im 29/33]
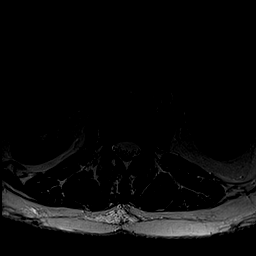
[im 33/33]
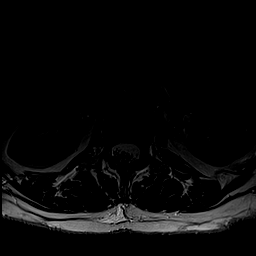

[Series 7: T2 · sagittal · 4.0mm · 0.88mm/px · 4 of 13 slices shown (2 of 2)]
[im 1/13]
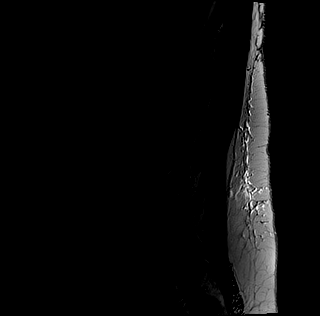
[im 5/13]
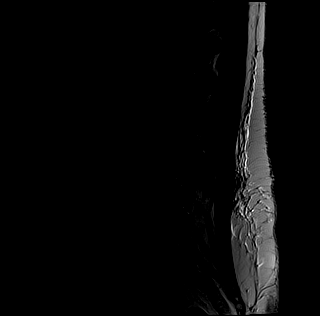
[im 9/13]
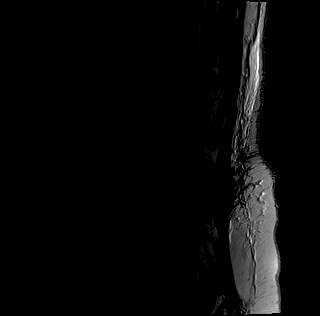
[im 13/13]
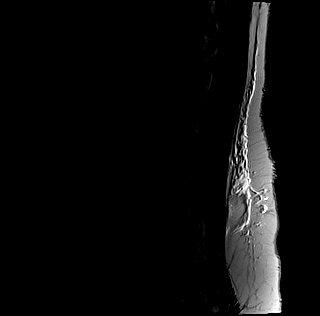

[Series 8: T1 fat-sat post-contrast · sagittal · 4.0mm · 0.88mm/px · 4 of 13 slices shown]
[im 1/13]
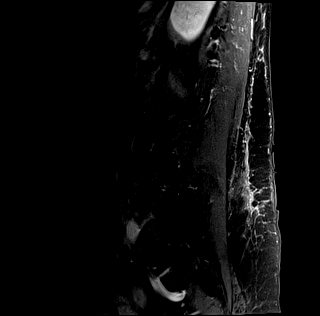
[im 5/13]
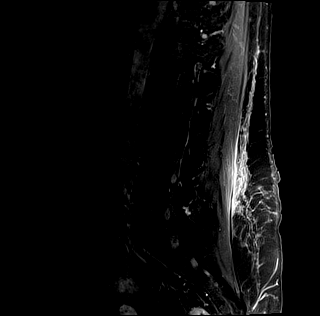
[im 9/13]
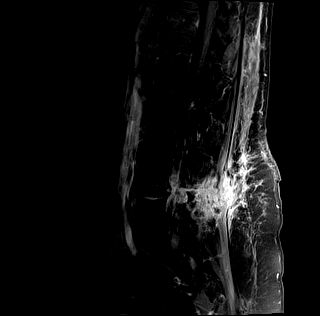
[im 13/13]
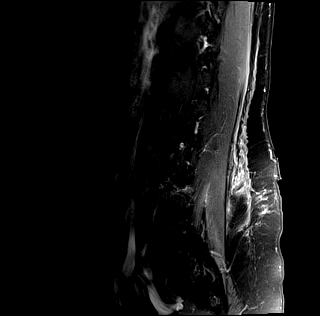

[Series 9: T1 post-contrast · axial · 4.0mm · 0.74mm/px · z∈[-61,+104]mm · 8 of 33 slices shown]
[im 1/33]
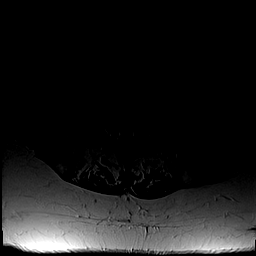
[im 4/33]
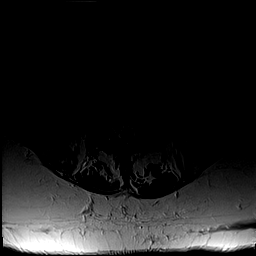
[im 11/33]
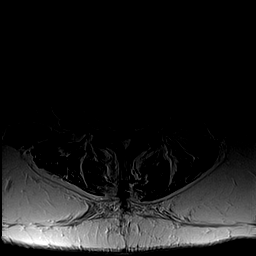
[im 15/33]
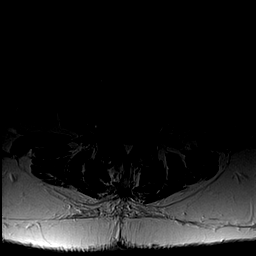
[im 18/33]
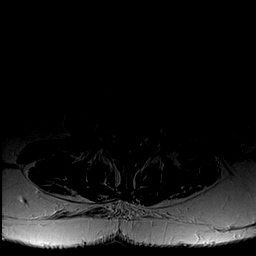
[im 22/33]
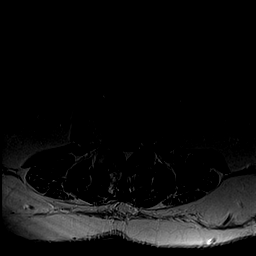
[im 29/33]
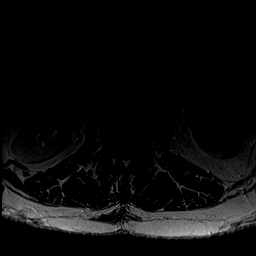
[im 33/33]
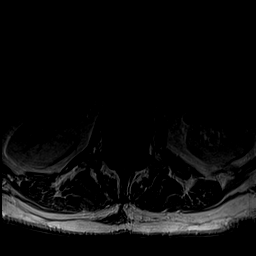

[44 of 48 positions shown; findings below may reference images not displayed]

FINDINGS: The vertebral bodies of the lumbar spine are normal in size. The
vertebral bodies of the lumbar spine are normal in alignment. There
is normal bone marrow signal demonstrated throughout the vertebra.
There is disc desiccation at L4-5. There is evidence of right L4-5
discectomy with a right laminotomy defect.

The spinal cord is normal in signal and contour. The cord terminates
normally at L1 . The nerve roots of the cauda equina and the filum
terminale are normal.

The visualized portions of the SI joints are unremarkable.

The imaged intra-abdominal contents are unremarkable.

T12-L1: No significant disc bulge. No evidence of neural foraminal
stenosis. No central canal stenosis.

L1-L2: No significant disc bulge. No evidence of neural foraminal
stenosis. No central canal stenosis.

L2-L3: Mild broad-based disc bulge. Mild bilateral facet
arthropathy. No evidence of neural foraminal stenosis. No central
canal stenosis.

L3-L4: Mild broad-based disc bulge. Mild bilateral facet
arthropathy. No evidence of neural foraminal stenosis. No central
canal stenosis.

L4-L5: There is central epidural fibrosis extending along the right
paracentral aspect of the thecal sac which abuts the right L5 nerve
root in the lateral recess. There is mild spinal stenosis. No
evidence of neural foraminal stenosis.

L5-S1: No significant disc bulge. No evidence of neural foraminal
stenosis. No central canal stenosis. Mild bilateral facet
arthropathy.
IMPRESSION: 1. At L4-5 there is evidence of prior discectomy. There is central
epidural fibrosis extending along the right paracentral aspect of
the thecal sac which abuts the right L5 nerve root in the lateral
recess which may result in nerve root irritation. There is bilateral
lateral recess stenosis. There is mild spinal stenosis.

## 2016-11-29 DIAGNOSIS — Z1231 Encounter for screening mammogram for malignant neoplasm of breast: Secondary | ICD-10-CM | POA: Diagnosis not present

## 2016-12-13 DIAGNOSIS — K2990 Gastroduodenitis, unspecified, without bleeding: Secondary | ICD-10-CM | POA: Diagnosis not present

## 2016-12-13 DIAGNOSIS — F411 Generalized anxiety disorder: Secondary | ICD-10-CM | POA: Diagnosis not present

## 2016-12-13 DIAGNOSIS — E785 Hyperlipidemia, unspecified: Secondary | ICD-10-CM | POA: Diagnosis not present

## 2016-12-13 DIAGNOSIS — R1084 Generalized abdominal pain: Secondary | ICD-10-CM | POA: Diagnosis not present

## 2017-01-08 DIAGNOSIS — L700 Acne vulgaris: Secondary | ICD-10-CM | POA: Diagnosis not present

## 2017-01-08 DIAGNOSIS — L920 Granuloma annulare: Secondary | ICD-10-CM | POA: Diagnosis not present

## 2017-01-23 DIAGNOSIS — J028 Acute pharyngitis due to other specified organisms: Secondary | ICD-10-CM | POA: Diagnosis not present

## 2017-01-23 DIAGNOSIS — R05 Cough: Secondary | ICD-10-CM | POA: Diagnosis not present

## 2017-01-23 DIAGNOSIS — J0100 Acute maxillary sinusitis, unspecified: Secondary | ICD-10-CM | POA: Diagnosis not present

## 2017-03-10 DIAGNOSIS — K5909 Other constipation: Secondary | ICD-10-CM | POA: Diagnosis not present

## 2017-03-10 DIAGNOSIS — K2950 Unspecified chronic gastritis without bleeding: Secondary | ICD-10-CM | POA: Diagnosis not present

## 2017-04-16 DIAGNOSIS — L920 Granuloma annulare: Secondary | ICD-10-CM | POA: Diagnosis not present

## 2017-04-23 DIAGNOSIS — R10813 Right lower quadrant abdominal tenderness: Secondary | ICD-10-CM | POA: Diagnosis not present

## 2017-04-23 DIAGNOSIS — R110 Nausea: Secondary | ICD-10-CM | POA: Diagnosis not present

## 2017-04-23 DIAGNOSIS — K582 Mixed irritable bowel syndrome: Secondary | ICD-10-CM | POA: Diagnosis not present

## 2017-07-29 DIAGNOSIS — R109 Unspecified abdominal pain: Secondary | ICD-10-CM | POA: Diagnosis not present

## 2017-07-29 DIAGNOSIS — K2990 Gastroduodenitis, unspecified, without bleeding: Secondary | ICD-10-CM | POA: Diagnosis not present

## 2017-07-29 DIAGNOSIS — F411 Generalized anxiety disorder: Secondary | ICD-10-CM | POA: Diagnosis not present

## 2017-07-29 DIAGNOSIS — E785 Hyperlipidemia, unspecified: Secondary | ICD-10-CM | POA: Diagnosis not present

## 2017-07-29 DIAGNOSIS — Z0000 Encounter for general adult medical examination without abnormal findings: Secondary | ICD-10-CM | POA: Diagnosis not present

## 2018-01-20 DIAGNOSIS — J019 Acute sinusitis, unspecified: Secondary | ICD-10-CM | POA: Diagnosis not present

## 2018-02-12 DIAGNOSIS — R197 Diarrhea, unspecified: Secondary | ICD-10-CM | POA: Diagnosis not present

## 2018-02-12 DIAGNOSIS — F411 Generalized anxiety disorder: Secondary | ICD-10-CM | POA: Diagnosis not present

## 2018-02-12 DIAGNOSIS — R109 Unspecified abdominal pain: Secondary | ICD-10-CM | POA: Diagnosis not present

## 2018-02-12 DIAGNOSIS — Z634 Disappearance and death of family member: Secondary | ICD-10-CM | POA: Diagnosis not present

## 2018-02-17 DIAGNOSIS — R197 Diarrhea, unspecified: Secondary | ICD-10-CM | POA: Diagnosis not present

## 2018-02-17 DIAGNOSIS — R109 Unspecified abdominal pain: Secondary | ICD-10-CM | POA: Diagnosis not present

## 2018-02-19 DIAGNOSIS — R9389 Abnormal findings on diagnostic imaging of other specified body structures: Secondary | ICD-10-CM | POA: Diagnosis not present

## 2018-02-26 ENCOUNTER — Other Ambulatory Visit: Payer: Self-pay | Admitting: Family Medicine

## 2018-02-26 DIAGNOSIS — R1030 Lower abdominal pain, unspecified: Secondary | ICD-10-CM

## 2018-02-26 DIAGNOSIS — R197 Diarrhea, unspecified: Secondary | ICD-10-CM

## 2018-03-04 ENCOUNTER — Ambulatory Visit
Admission: RE | Admit: 2018-03-04 | Discharge: 2018-03-04 | Disposition: A | Discharge status: Home / Self Care | Payer: Medicare Other | Source: Ambulatory Visit | Attending: Family Medicine | Admitting: Family Medicine

## 2018-03-04 DIAGNOSIS — R1030 Lower abdominal pain, unspecified: Secondary | ICD-10-CM | POA: Diagnosis not present

## 2018-03-04 DIAGNOSIS — R197 Diarrhea, unspecified: Secondary | ICD-10-CM

## 2018-03-04 MED ORDER — IOPAMIDOL (ISOVUE-300) INJECTION 61%
100.0000 mL | Freq: Once | INTRAVENOUS | Status: AC | PRN
  Administered 2018-03-04: 100 mL via INTRAVENOUS

## 2018-06-05 ENCOUNTER — Encounter: Payer: Self-pay | Admitting: Family Medicine

## 2018-11-25 DIAGNOSIS — Z5181 Encounter for therapeutic drug level monitoring: Secondary | ICD-10-CM | POA: Diagnosis not present

## 2018-11-25 DIAGNOSIS — F329 Major depressive disorder, single episode, unspecified: Secondary | ICD-10-CM | POA: Diagnosis not present

## 2018-11-25 DIAGNOSIS — R51 Headache: Secondary | ICD-10-CM | POA: Diagnosis not present

## 2018-11-25 DIAGNOSIS — F419 Anxiety disorder, unspecified: Secondary | ICD-10-CM | POA: Diagnosis not present

## 2019-04-16 DIAGNOSIS — M2011 Hallux valgus (acquired), right foot: Secondary | ICD-10-CM | POA: Diagnosis not present

## 2019-04-16 DIAGNOSIS — M7741 Metatarsalgia, right foot: Secondary | ICD-10-CM | POA: Diagnosis not present

## 2019-04-16 DIAGNOSIS — M7742 Metatarsalgia, left foot: Secondary | ICD-10-CM | POA: Diagnosis not present

## 2019-04-16 DIAGNOSIS — L851 Acquired keratosis [keratoderma] palmaris et plantaris: Secondary | ICD-10-CM | POA: Diagnosis not present

## 2019-04-16 DIAGNOSIS — M2041 Other hammer toe(s) (acquired), right foot: Secondary | ICD-10-CM | POA: Diagnosis not present

## 2019-04-16 DIAGNOSIS — M2042 Other hammer toe(s) (acquired), left foot: Secondary | ICD-10-CM | POA: Diagnosis not present

## 2019-04-20 DIAGNOSIS — M7062 Trochanteric bursitis, left hip: Secondary | ICD-10-CM | POA: Diagnosis not present

## 2019-04-20 DIAGNOSIS — I872 Venous insufficiency (chronic) (peripheral): Secondary | ICD-10-CM | POA: Diagnosis not present

## 2019-04-20 DIAGNOSIS — Z1239 Encounter for other screening for malignant neoplasm of breast: Secondary | ICD-10-CM | POA: Diagnosis not present

## 2019-05-25 DIAGNOSIS — L920 Granuloma annulare: Secondary | ICD-10-CM | POA: Diagnosis not present

## 2019-05-25 DIAGNOSIS — L700 Acne vulgaris: Secondary | ICD-10-CM | POA: Diagnosis not present

## 2019-07-14 DIAGNOSIS — J0100 Acute maxillary sinusitis, unspecified: Secondary | ICD-10-CM | POA: Diagnosis not present

## 2019-07-23 DIAGNOSIS — Z79899 Other long term (current) drug therapy: Secondary | ICD-10-CM | POA: Diagnosis not present

## 2019-07-23 DIAGNOSIS — E785 Hyperlipidemia, unspecified: Secondary | ICD-10-CM | POA: Diagnosis not present

## 2019-07-23 DIAGNOSIS — R51 Headache: Secondary | ICD-10-CM | POA: Diagnosis not present

## 2019-07-23 DIAGNOSIS — Z20828 Contact with and (suspected) exposure to other viral communicable diseases: Secondary | ICD-10-CM | POA: Diagnosis not present

## 2019-07-23 DIAGNOSIS — J3489 Other specified disorders of nose and nasal sinuses: Secondary | ICD-10-CM | POA: Diagnosis not present

## 2019-07-23 DIAGNOSIS — N281 Cyst of kidney, acquired: Secondary | ICD-10-CM | POA: Diagnosis not present

## 2019-07-23 DIAGNOSIS — R109 Unspecified abdominal pain: Secondary | ICD-10-CM | POA: Diagnosis not present

## 2019-08-06 DIAGNOSIS — J3489 Other specified disorders of nose and nasal sinuses: Secondary | ICD-10-CM | POA: Diagnosis not present

## 2019-08-06 DIAGNOSIS — E785 Hyperlipidemia, unspecified: Secondary | ICD-10-CM | POA: Diagnosis not present

## 2019-08-06 DIAGNOSIS — F411 Generalized anxiety disorder: Secondary | ICD-10-CM | POA: Diagnosis not present

## 2019-08-06 DIAGNOSIS — M7062 Trochanteric bursitis, left hip: Secondary | ICD-10-CM | POA: Diagnosis not present

## 2019-08-18 DIAGNOSIS — R208 Other disturbances of skin sensation: Secondary | ICD-10-CM | POA: Diagnosis not present

## 2019-09-01 DIAGNOSIS — R1084 Generalized abdominal pain: Secondary | ICD-10-CM | POA: Diagnosis not present

## 2019-12-12 ENCOUNTER — Ambulatory Visit: Payer: Medicare Other | Attending: Internal Medicine

## 2019-12-12 DIAGNOSIS — Z23 Encounter for immunization: Secondary | ICD-10-CM | POA: Insufficient documentation

## 2019-12-12 NOTE — Progress Notes (Signed)
   Covid-[facility] Vaccination [facility]  Name:  Teresa Hopkins    MRN: [id number] DOB: 1951/01/26  12/12/2019  [name] was observed post Covid-19 immunization for 15 minutes without incidence. She was provided with Vaccine Information Sheet and instruction to access the V-Safe system.   [name] was instructed to call [phone] with any severe reactions post vaccine: Marland Kitchen Difficulty breathing  . Swelling of your face and throat  . A fast heartbeat  . A bad rash all over your body  . Dizziness and weakness    Immunizations Administered    Name Date Dose VIS Date Route   Pfizer COVID-19 Vaccine 12/12/2019  3:45 PM 0.3 mL 10/15/2019 Intramuscular   Manufacturer: [employer], [employer]   Lot: [id number]   NDC: [address]-3

## 2019-12-28 ENCOUNTER — Ambulatory Visit: Payer: Medicare Other

## 2020-01-06 ENCOUNTER — Ambulatory Visit: Payer: Medicare Other

## 2020-01-10 ENCOUNTER — Other Ambulatory Visit: Payer: Self-pay | Admitting: Orthopedic Surgery

## 2020-01-11 ENCOUNTER — Other Ambulatory Visit: Payer: Self-pay | Admitting: Orthopedic Surgery

## 2020-01-13 ENCOUNTER — Other Ambulatory Visit: Payer: Self-pay | Admitting: Orthopedic Surgery

## 2020-01-13 NOTE — Patient Instructions (Addendum)
DUE TO COVID-19 ONLY ONE VISITOR IS ALLOWED TO COME WITH YOU AND STAY IN THE WAITING ROOM ONLY DURING PRE OP AND PROCEDURE DAY OF SURGERY. THE 1 VISITOR MAY VISIT WITH YOU AFTER SURGERY IN YOUR PRIVATE ROOM DURING VISITING HOURS ONLY!  YOU NEED TO HAVE A COVID 19 TEST ON 01-18-20 @ 10:30 AM, THIS TEST MUST BE DONE BEFORE SURGERY, COME  [address], [place] , [id number].  [facility]) ONCE YOUR COVID TEST IS COMPLETED, PLEASE BEGIN THE QUARANTINE INSTRUCTIONS AS OUTLINED IN YOUR HANDOUT.                Teresa Hopkins  01/13/2020   Your procedure is scheduled on: 01-21-20   Report to [facility] Main  Entrance    Report to Admitting at 12:05 PM     Call this number if you have problems the morning of surgery [phone]    Remember: NO SOLID FOOD AFTER MIDNIGHT THE NIGHT PRIOR TO SURGERY. NOTHING BY MOUTH EXCEPT CLEAR LIQUIDS UNTIL 11:35 AM . PLEASE FINISH ENSURE DRINK PER SURGEON ORDER  WHICH NEEDS TO BE COMPLETED AT  11:35 AM.   CLEAR LIQUID DIET   Foods Allowed                                                                     Foods Excluded  Coffee and tea, regular and decaf                             liquids that you cannot  Plain Jell-O any favor except red or purple                                           see through such as: Fruit ices (not with fruit pulp)                                     milk, soups, orange juice  Iced Popsicles                                    All solid food Carbonated beverages, regular and diet                                    Cranberry, grape and apple juices Sports drinks like Gatorade Lightly seasoned clear broth or consume(fat free) Sugar, honey syrup   _____________________________________________________________________     Take these medicines the morning of surgery with A SIP OF WATER: Fluoxetine (Prozac), Lorazepam (Ativan), and Pantoprazole (Protonix)  BRUSH YOUR TEETH MORNING OF SURGERY AND  RINSE YOUR MOUTH OUT, NO CHEWING GUM CANDY OR MINTS.                                  You may not have any  metal on your body including hair pins and              piercings     Do not wear jewelry, make-up, lotions, powders or perfumes, deodorant              Do not wear nail polish on your fingernails.  Do not shave  48 hours prior to surgery.               Do not bring valuables to the hospital. [place].  Contacts, dentures or bridgework may not be worn into surgery.       Patients discharged the day of surgery will not be allowed to drive home. IF YOU ARE HAVING SURGERY AND GOING HOME THE SAME DAY, YOU MUST HAVE AN ADULT TO DRIVE YOU HOME AND BE WITH YOU FOR 24 HOURS. YOU MAY GO HOME BY TAXI OR UBER OR ORTHERWISE, BUT AN ADULT MUST ACCOMPANY YOU HOME AND STAY WITH YOU FOR 24 HOURS.  Name and phone number of your driver: [name] [phone]  Special Instructions: N/A              Please read over the following fact sheets you were given: _____________________________________________________________________             [facility] - Preparing for Surgery Before surgery, you can play an important role.  Because skin is not sterile, your skin needs to be as free of germs as possible.  You can reduce the number of germs on your skin by washing with CHG (chlorahexidine gluconate) soap before surgery.  CHG is an antiseptic cleaner which kills germs and bonds with the skin to continue killing germs even after washing. Please DO NOT use if you have an allergy to CHG or antibacterial soaps.  If your skin becomes reddened/irritated stop using the CHG and inform your nurse when you arrive at Short Stay. Do not shave (including legs and underarms) for at least 48 hours prior to the first CHG shower.  You may shave your face/neck. Please follow these instructions carefully:  1.  Shower with CHG Soap the night before surgery and  the  morning of Surgery.  2.  If you choose to wash your hair, wash your hair first as usual with your  normal  shampoo.  3.  After you shampoo, rinse your hair and body thoroughly to remove the  shampoo.                           4.  Use CHG as you would any other liquid soap.  You can apply chg directly  to the skin and wash                       Gently with a scrungie or clean washcloth.  5.  Apply the CHG Soap to your body ONLY FROM THE NECK DOWN.   Do not use on face/ open                           Wound or open sores. Avoid contact with eyes, ears mouth and genitals (private parts).                       Wash face,  Genitals (  private parts) with your normal soap.             6.  Wash thoroughly, paying special attention to the area where your surgery  will be performed.  7.  Thoroughly rinse your body with warm water from the neck down.  8.  DO NOT shower/wash with your normal soap after using and rinsing off  the CHG Soap.                9.  Pat yourself dry with a clean towel.            10.  Wear clean pajamas.            11.  Place clean sheets on your bed the night of your first shower and do not  sleep with pets. Day of Surgery : Do not apply any lotions/deodorants the morning of surgery.  Please wear clean clothes to the hospital/surgery center.  FAILURE TO FOLLOW THESE INSTRUCTIONS MAY RESULT IN THE CANCELLATION OF YOUR SURGERY PATIENT SIGNATURE_________________________________  NURSE SIGNATURE__________________________________  ________________________________________________________________________   [name]  An incentive spirometer is a tool that can help keep your lungs clear and active. This tool measures how well you are filling your lungs with each breath. Taking long deep breaths may help reverse or decrease the chance of developing breathing (pulmonary) problems (especially infection) following:  A long period of time when you are unable to move or be  active. BEFORE THE PROCEDURE   If the spirometer includes an indicator to show your best effort, your nurse or respiratory therapist will set it to a desired goal.  If possible, sit up straight or lean slightly forward. Try not to slouch.  Hold the incentive spirometer in an upright position. INSTRUCTIONS FOR USE  1. Sit on the edge of your bed if possible, or sit up as far as you can in bed or on a chair. 2. Hold the incentive spirometer in an upright position. 3. Breathe out normally. 4. Place the mouthpiece in your mouth and seal your lips tightly around it. 5. Breathe in slowly and as deeply as possible, raising the piston or the ball toward the top of the column. 6. Hold your breath for 3-5 seconds or for as long as possible. Allow the piston or ball to fall to the bottom of the column. 7. Remove the mouthpiece from your mouth and breathe out normally. 8. Rest for a few seconds and repeat Steps 1 through 7 at least 10 times every 1-2 hours when you are awake. Take your time and take a few normal breaths between deep breaths. 9. The spirometer may include an indicator to show your best effort. Use the indicator as a goal to work toward during each repetition. 10. After each set of 10 deep breaths, practice coughing to be sure your lungs are clear. If you have an incision (the cut made at the time of surgery), support your incision when coughing by placing a pillow or rolled up towels firmly against it. Once you are able to get out of bed, walk around indoors and cough well. You may stop using the incentive spirometer when instructed by your caregiver.  RISKS AND COMPLICATIONS  Take your time so you do not get dizzy or light-headed.  If you are in pain, you may need to take or ask for pain medication before doing incentive spirometry. It is harder to take a deep breath if you are having pain. AFTER USE  Rest  and breathe slowly and easily.  It can be helpful to keep track of a log of  your progress. Your caregiver can provide you with a simple table to help with this. If you are using the spirometer at home, follow these instructions: SEEK MEDICAL CARE IF:   You are having difficultly using the spirometer.  You have trouble using the spirometer as often as instructed.  Your pain medication is not giving enough relief while using the spirometer.  You develop fever of 100.5 F (38.1 C) or higher. SEEK IMMEDIATE MEDICAL CARE IF:   You cough up bloody sputum that had not been present before.  You develop fever of 102 F (38.9 C) or greater.  You develop worsening pain at or near the incision site. MAKE SURE YOU:   Understand these instructions.  Will watch your condition.  Will get help right away if you are not doing well or get worse. Document Released: 03/03/2007 Document Revised: 01/13/2012 Document Reviewed: 05/04/2007 Exit[employer] Patient Information 2014 [employer], [place].   ________________________________________________________________________  WHAT IS A BLOOD TRANSFUSION? Blood Transfusion Information  A transfusion is the replacement of blood or some of its parts. Blood is made up of multiple cells which provide different functions.  Red blood cells carry oxygen and are used for blood loss replacement.  White blood cells fight against infection.  Platelets control bleeding.  Plasma helps clot blood.  Other blood products are available for specialized needs, such as hemophilia or other clotting disorders. BEFORE THE TRANSFUSION  Who gives blood for transfusions?   Healthy volunteers who are fully evaluated to make sure their blood is safe. This is blood bank blood. Transfusion therapy is the safest it has ever been in the practice of medicine. Before blood is taken from a donor, a complete history is taken to make sure that person has no history of diseases nor engages in risky social behavior (examples are intravenous drug use or sexual activity  with multiple partners). The donor's travel history is screened to minimize risk of transmitting infections, such as malaria. The donated blood is tested for signs of infectious diseases, such as HIV and hepatitis. The blood is then tested to be sure it is compatible with you in order to minimize the chance of a transfusion reaction. If you or a relative donates blood, this is often done in anticipation of surgery and is not appropriate for emergency situations. It takes many days to process the donated blood. RISKS AND COMPLICATIONS Although transfusion therapy is very safe and saves many lives, the main dangers of transfusion include:   Getting an infectious disease.  Developing a transfusion reaction. This is an allergic reaction to something in the blood you were given. Every precaution is taken to prevent this. The decision to have a blood transfusion has been considered carefully by your caregiver before blood is given. Blood is not given unless the benefits outweigh the risks. AFTER THE TRANSFUSION  Right after receiving a blood transfusion, you will usually feel much better and more energetic. This is especially true if your red blood cells have gotten low (anemic). The transfusion raises the level of the red blood cells which carry oxygen, and this usually causes an energy increase.  The nurse administering the transfusion will monitor you carefully for complications. HOME CARE INSTRUCTIONS  No special instructions are needed after a transfusion. You may find your energy is better. Speak with your caregiver about any limitations on activity for underlying diseases you may have. SEEK  MEDICAL CARE IF:   Your condition is not improving after your transfusion.  You develop redness or irritation at the intravenous (IV) site. SEEK IMMEDIATE MEDICAL CARE IF:  Any of the following symptoms occur over the next 12 hours:  Shaking chills.  You have a temperature by mouth above 102 F (38.9  C), not controlled by medicine.  Chest, back, or muscle pain.  People around you feel you are not acting correctly or are confused.  Shortness of breath or difficulty breathing.  Dizziness and fainting.  You get a rash or develop hives.  You have a decrease in urine output.  Your urine turns a dark color or changes to pink, red, or brown. Any of the following symptoms occur over the next 10 days:  You have a temperature by mouth above 102 F (38.9 C), not controlled by medicine.  Shortness of breath.  Weakness after normal activity.  The white part of the eye turns yellow (jaundice).  You have a decrease in the amount of urine or are urinating less often.  Your urine turns a dark color or changes to pink, red, or brown. Document Released: 10/18/2000 Document Revised: 01/13/2012 Document Reviewed: 06/06/2008 [facility] Patient Information 2014 [place], [place].  _______________________________________________________________________

## 2020-01-13 NOTE — Progress Notes (Addendum)
PCP - Betty G [place], MD Cardiologist -   Chest x-ray -  EKG - 01-12-20 on chart Stress Test -  ECHO -  Cardiac Cath -   Sleep Study -  CPAP -   Fasting Blood Sugar -  Checks Blood Sugar _____ times a day  Blood Thinner Instructions: Aspirin Instructions: Last Dose:  Anesthesia review:   Patient denies shortness of breath, fever, cough and chest pain at PAT appointment   Patient verbalized understanding of instructions that were given to them at the PAT appointment. Patient was also instructed that they will need to review over the PAT instructions again at home before surgery.

## 2020-01-14 ENCOUNTER — Encounter (HOSPITAL_COMMUNITY)
Admission: RE | Admit: 2020-01-14 | Discharge: 2020-01-14 | Disposition: A | Discharge status: Home / Self Care | Payer: Medicare Other | Source: Ambulatory Visit | Attending: Orthopedic Surgery | Admitting: Orthopedic Surgery

## 2020-01-14 ENCOUNTER — Other Ambulatory Visit: Payer: Self-pay

## 2020-01-14 ENCOUNTER — Encounter (HOSPITAL_COMMUNITY): Payer: Self-pay

## 2020-01-14 ENCOUNTER — Ambulatory Visit (HOSPITAL_COMMUNITY)
Admission: RE | Admit: 2020-01-14 | Discharge: 2020-01-14 | Disposition: A | Discharge status: Home / Self Care | Payer: Medicare Other | Source: Ambulatory Visit | Attending: Orthopedic Surgery | Admitting: Orthopedic Surgery

## 2020-01-14 DIAGNOSIS — Z01818 Encounter for other preprocedural examination: Secondary | ICD-10-CM | POA: Insufficient documentation

## 2020-01-14 DIAGNOSIS — Z01811 Encounter for preprocedural respiratory examination: Secondary | ICD-10-CM

## 2020-01-14 LAB — CBC WITH DIFFERENTIAL/PLATELET
Abs Immature Granulocytes: 0.02 10*3/uL (ref 0.00–0.07)
Basophils Absolute: 0 10*3/uL (ref 0.0–0.1)
Basophils Relative: 1 %
Eosinophils Absolute: 0 10*3/uL (ref 0.0–0.5)
Eosinophils Relative: 1 %
HCT: 41 % (ref 36.0–46.0)
Hemoglobin: 13.4 g/dL (ref 12.0–15.0)
Immature Granulocytes: 0 %
Lymphocytes Relative: 49 %
Lymphs Abs: 2.9 10*3/uL (ref 0.7–4.0)
MCH: 33.9 pg (ref 26.0–34.0)
MCHC: 32.7 g/dL (ref 30.0–36.0)
MCV: 103.8 fL — ABNORMAL HIGH (ref 80.0–100.0)
Monocytes Absolute: 0.6 10*3/uL (ref 0.1–1.0)
Monocytes Relative: 10 %
Neutro Abs: 2.3 10*3/uL (ref 1.7–7.7)
Neutrophils Relative %: 39 %
Platelets: 236 10*3/uL (ref 150–400)
RBC: 3.95 MIL/uL (ref 3.87–5.11)
RDW: 11.8 % (ref 11.5–15.5)
WBC: 5.8 10*3/uL (ref 4.0–10.5)
nRBC: 0 % (ref 0.0–0.2)

## 2020-01-14 LAB — COMPREHENSIVE METABOLIC PANEL
ALT: 18 U/L (ref 0–44)
AST: 20 U/L (ref 15–41)
Albumin: 4 g/dL (ref 3.5–5.0)
Alkaline Phosphatase: 55 U/L (ref 38–126)
Anion gap: 11 (ref 5–15)
BUN: 19 mg/dL (ref 8–23)
CO2: 26 mmol/L (ref 22–32)
Calcium: 9.3 mg/dL (ref 8.9–10.3)
Chloride: 106 mmol/L (ref 98–111)
Creatinine, Ser: 0.77 mg/dL (ref 0.44–1.00)
GFR calc Af Amer: >60 mL/min (ref >60)
GFR calc non Af Amer: >60 mL/min (ref >60)
Glucose, Bld: 76 mg/dL (ref 70–99)
Potassium: 4 mmol/L (ref 3.5–5.1)
Sodium: 143 mmol/L (ref 135–145)
Total Bilirubin: 0.8 mg/dL (ref 0.3–1.2)
Total Protein: 7.4 g/dL (ref 6.5–8.1)

## 2020-01-14 LAB — URINALYSIS, ROUTINE W REFLEX MICROSCOPIC
Bilirubin Urine: NEGATIVE
Glucose, UA: NEGATIVE mg/dL
Hgb urine dipstick: NEGATIVE
Ketones, ur: NEGATIVE mg/dL
Leukocytes,Ua: NEGATIVE
Nitrite: NEGATIVE
Protein, ur: NEGATIVE mg/dL
Specific Gravity, Urine: 1.015 (ref 1.005–1.030)
pH: 6 (ref 5.0–8.0)

## 2020-01-14 LAB — SURGICAL PCR SCREEN
MRSA, PCR: NEGATIVE
Staphylococcus aureus: NEGATIVE

## 2020-01-14 LAB — PROTIME-INR
INR: 0.9 (ref 0.8–1.2)
Prothrombin Time: 12.4 seconds (ref 11.4–15.2)

## 2020-01-14 LAB — APTT: aPTT: 24 seconds (ref 24–36)

## 2020-01-15 LAB — ABO/RH: ABO/RH(D): A POS

## 2020-01-18 ENCOUNTER — Other Ambulatory Visit (HOSPITAL_COMMUNITY)
Admission: RE | Admit: 2020-01-18 | Discharge: 2020-01-18 | Disposition: A | Discharge status: Home / Self Care | Payer: Medicare Other | Source: Ambulatory Visit | Attending: Orthopedic Surgery | Admitting: Orthopedic Surgery

## 2020-01-18 DIAGNOSIS — Z20822 Contact with and (suspected) exposure to covid-19: Secondary | ICD-10-CM | POA: Diagnosis not present

## 2020-01-18 DIAGNOSIS — Z01812 Encounter for preprocedural laboratory examination: Secondary | ICD-10-CM | POA: Insufficient documentation

## 2020-01-18 LAB — SARS CORONAVIRUS 2 (TAT 6-24 HRS): SARS Coronavirus 2: NEGATIVE

## 2020-01-19 ENCOUNTER — Other Ambulatory Visit: Payer: Self-pay | Admitting: Orthopedic Surgery

## 2020-01-20 MED ORDER — BUPIVACAINE LIPOSOME 1.3 % IJ SUSP
10.0000 mL | Freq: Once | INTRAMUSCULAR | Status: DC
  Filled 2020-01-20: qty 10

## 2020-01-20 NOTE — Progress Notes (Signed)
Called patient with time change.  Surgery time 0051-1021.  Patient to arrive at 0900am.  Clear liquids until 0825am.  Ensure drink completed by 0825am.

## 2020-01-21 ENCOUNTER — Ambulatory Visit (HOSPITAL_COMMUNITY): Payer: Medicare Other | Admitting: Anesthesiology

## 2020-01-21 ENCOUNTER — Ambulatory Visit (HOSPITAL_COMMUNITY): Payer: Medicare Other

## 2020-01-21 ENCOUNTER — Ambulatory Visit (HOSPITAL_COMMUNITY)
Admission: RE | Admit: 2020-01-21 | Discharge: 2020-01-21 | Disposition: A | Discharge status: Home / Self Care | Payer: Medicare Other | Attending: Orthopedic Surgery | Admitting: Orthopedic Surgery

## 2020-01-21 ENCOUNTER — Encounter (HOSPITAL_COMMUNITY): Payer: Self-pay | Admitting: Orthopedic Surgery

## 2020-01-21 ENCOUNTER — Encounter (HOSPITAL_COMMUNITY)
Admission: RE | Disposition: A | Discharge status: Home / Self Care | Payer: Self-pay | Source: Home / Self Care | Attending: Orthopedic Surgery

## 2020-01-21 DIAGNOSIS — F329 Major depressive disorder, single episode, unspecified: Secondary | ICD-10-CM | POA: Insufficient documentation

## 2020-01-21 DIAGNOSIS — M1612 Unilateral primary osteoarthritis, left hip: Secondary | ICD-10-CM | POA: Diagnosis present

## 2020-01-21 DIAGNOSIS — Z87891 Personal history of nicotine dependence: Secondary | ICD-10-CM | POA: Diagnosis not present

## 2020-01-21 DIAGNOSIS — F419 Anxiety disorder, unspecified: Secondary | ICD-10-CM | POA: Insufficient documentation

## 2020-01-21 DIAGNOSIS — Z79899 Other long term (current) drug therapy: Secondary | ICD-10-CM | POA: Diagnosis not present

## 2020-01-21 DIAGNOSIS — Z419 Encounter for procedure for purposes other than remedying health state, unspecified: Secondary | ICD-10-CM

## 2020-01-21 DIAGNOSIS — E739 Lactose intolerance, unspecified: Secondary | ICD-10-CM | POA: Diagnosis not present

## 2020-01-21 DIAGNOSIS — Z01811 Encounter for preprocedural respiratory examination: Secondary | ICD-10-CM

## 2020-01-21 DIAGNOSIS — E785 Hyperlipidemia, unspecified: Secondary | ICD-10-CM | POA: Diagnosis not present

## 2020-01-21 HISTORY — PX: TOTAL HIP ARTHROPLASTY: SHX124

## 2020-01-21 LAB — TYPE AND SCREEN
ABO/RH(D): A POS
Antibody Screen: NEGATIVE

## 2020-01-21 SURGERY — ARTHROPLASTY, HIP, TOTAL, ANTERIOR APPROACH
Anesthesia: Spinal | Site: Hip | Laterality: Left

## 2020-01-21 MED ORDER — FENTANYL CITRATE (PF) 100 MCG/2ML IJ SOLN: 25.0000 ug | INTRAMUSCULAR | Status: DC | PRN

## 2020-01-21 MED ORDER — DEXAMETHASONE SODIUM PHOSPHATE 10 MG/ML IJ SOLN
INTRAMUSCULAR | Status: DC | PRN
  Administered 2020-01-21: 10 mg via INTRAVENOUS

## 2020-01-21 MED ORDER — BUPIVACAINE-EPINEPHRINE 0.25% -1:200000 IJ SOLN
INTRAMUSCULAR | Status: DC | PRN
  Administered 2020-01-21: 30 mL

## 2020-01-21 MED ORDER — CELECOXIB 200 MG PO CAPS
200.0000 mg | ORAL_CAPSULE | Freq: Once | ORAL | Status: AC
  Administered 2020-01-21: 10:00:00 200 mg via ORAL
  Filled 2020-01-21: qty 1

## 2020-01-21 MED ORDER — LACTATED RINGERS IV BOLUS
500.0000 mL | Freq: Once | INTRAVENOUS | Status: AC
  Administered 2020-01-21: 13:00:00 500 mL via INTRAVENOUS

## 2020-01-21 MED ORDER — ACETAMINOPHEN 325 MG PO TABS: 325.0000 mg | ORAL_TABLET | Freq: Four times a day (QID) | ORAL | Status: DC | PRN

## 2020-01-21 MED ORDER — PHENYLEPHRINE HCL-NACL 10-0.9 MG/250ML-% IV SOLN
INTRAVENOUS | Status: DC | PRN
  Administered 2020-01-21: 10 ug/min via INTRAVENOUS

## 2020-01-21 MED ORDER — MIDAZOLAM HCL 2 MG/2ML IJ SOLN
INTRAMUSCULAR | Status: AC
  Filled 2020-01-21: qty 2

## 2020-01-21 MED ORDER — FENTANYL CITRATE (PF) 100 MCG/2ML IJ SOLN
INTRAMUSCULAR | Status: DC | PRN
  Administered 2020-01-21 (×2): 50 ug via INTRAVENOUS

## 2020-01-21 MED ORDER — POVIDONE-IODINE 10 % EX SWAB
2.0000 "application " | Freq: Once | CUTANEOUS | Status: AC
  Administered 2020-01-21: 2 via TOPICAL

## 2020-01-21 MED ORDER — ASPIRIN EC 325 MG PO TBEC: 325.0000 mg | DELAYED_RELEASE_TABLET | Freq: Two times a day (BID) | ORAL | 0 refills | Status: DC

## 2020-01-21 MED ORDER — CEFAZOLIN SODIUM-DEXTROSE 2-4 GM/100ML-% IV SOLN
2.0000 g | Freq: Four times a day (QID) | INTRAVENOUS | Status: AC
  Administered 2020-01-21: 2 g via INTRAVENOUS

## 2020-01-21 MED ORDER — FENTANYL CITRATE (PF) 100 MCG/2ML IJ SOLN
INTRAMUSCULAR | Status: AC
  Filled 2020-01-21: qty 2

## 2020-01-21 MED ORDER — PROPOFOL 10 MG/ML IV BOLUS
INTRAVENOUS | Status: DC | PRN
  Administered 2020-01-21: 30 mg via INTRAVENOUS

## 2020-01-21 MED ORDER — TIZANIDINE HCL 2 MG PO TABS: 2.0000 mg | ORAL_TABLET | Freq: Three times a day (TID) | ORAL | 0 refills | Status: DC | PRN

## 2020-01-21 MED ORDER — METHOCARBAMOL 500 MG IVPB - SIMPLE MED: 500.0000 mg | Freq: Four times a day (QID) | INTRAVENOUS | Status: DC | PRN

## 2020-01-21 MED ORDER — ONDANSETRON HCL 4 MG/2ML IJ SOLN
INTRAMUSCULAR | Status: DC | PRN
  Administered 2020-01-21: 4 mg via INTRAVENOUS

## 2020-01-21 MED ORDER — LACTATED RINGERS IV BOLUS
250.0000 mL | Freq: Once | INTRAVENOUS | Status: AC
  Administered 2020-01-21: 250 mL via INTRAVENOUS

## 2020-01-21 MED ORDER — BUPIVACAINE HCL (PF) 0.25 % IJ SOLN
INTRAMUSCULAR | Status: AC
  Filled 2020-01-21: qty 30

## 2020-01-21 MED ORDER — WATER FOR IRRIGATION, STERILE IR SOLN
Status: DC | PRN
  Administered 2020-01-21: 2000 mL

## 2020-01-21 MED ORDER — ACETAMINOPHEN 325 MG PO TABS
ORAL_TABLET | ORAL | Status: AC
  Administered 2020-01-21: 16:00:00 650 mg via ORAL
  Filled 2020-01-21: qty 2

## 2020-01-21 MED ORDER — ONDANSETRON HCL 4 MG/2ML IJ SOLN: 4.0000 mg | Freq: Four times a day (QID) | INTRAMUSCULAR | Status: DC | PRN

## 2020-01-21 MED ORDER — OXYCODONE-ACETAMINOPHEN 5-325 MG PO TABS: 1.0000 | ORAL_TABLET | Freq: Four times a day (QID) | ORAL | 0 refills | Status: DC | PRN

## 2020-01-21 MED ORDER — LACTATED RINGERS IV SOLN: INTRAVENOUS | Status: DC

## 2020-01-21 MED ORDER — PROPOFOL 1000 MG/100ML IV EMUL
INTRAVENOUS | Status: AC
  Filled 2020-01-21: qty 100

## 2020-01-21 MED ORDER — ONDANSETRON HCL 4 MG PO TABS
4.0000 mg | ORAL_TABLET | Freq: Four times a day (QID) | ORAL | Status: DC | PRN
  Filled 2020-01-21: qty 1

## 2020-01-21 MED ORDER — GLYCOPYRROLATE 0.2 MG/ML IJ SOLN
INTRAMUSCULAR | Status: DC | PRN
  Administered 2020-01-21: .1 mg via INTRAVENOUS

## 2020-01-21 MED ORDER — GLYCOPYRROLATE PF 0.2 MG/ML IJ SOSY
PREFILLED_SYRINGE | INTRAMUSCULAR | Status: AC
  Filled 2020-01-21: qty 2

## 2020-01-21 MED ORDER — CEFAZOLIN SODIUM-DEXTROSE 2-4 GM/100ML-% IV SOLN: 2.0000 g | INTRAVENOUS | Status: DC

## 2020-01-21 MED ORDER — CEFAZOLIN SODIUM-DEXTROSE 2-4 GM/100ML-% IV SOLN
2.0000 g | INTRAVENOUS | Status: AC
  Administered 2020-01-21: 2 g via INTRAVENOUS
  Filled 2020-01-21: qty 100

## 2020-01-21 MED ORDER — PROPOFOL 10 MG/ML IV BOLUS
INTRAVENOUS | Status: AC
  Filled 2020-01-21: qty 20

## 2020-01-21 MED ORDER — MIDAZOLAM HCL 5 MG/5ML IJ SOLN
INTRAMUSCULAR | Status: DC | PRN
  Administered 2020-01-21: 2 mg via INTRAVENOUS

## 2020-01-21 MED ORDER — OXYCODONE HCL 5 MG PO TABS
ORAL_TABLET | ORAL | Status: AC
  Administered 2020-01-21: 5 mg via ORAL
  Filled 2020-01-21: qty 1

## 2020-01-21 MED ORDER — ASPIRIN EC 325 MG PO TBEC: 325.0000 mg | DELAYED_RELEASE_TABLET | Freq: Two times a day (BID) | ORAL | Status: DC

## 2020-01-21 MED ORDER — CEFAZOLIN SODIUM-DEXTROSE 2-4 GM/100ML-% IV SOLN
INTRAVENOUS | Status: AC
  Filled 2020-01-21: qty 100

## 2020-01-21 MED ORDER — ACETAMINOPHEN 500 MG PO TABS
1000.0000 mg | ORAL_TABLET | Freq: Once | ORAL | Status: AC
  Administered 2020-01-21: 1000 mg via ORAL
  Filled 2020-01-21: qty 2

## 2020-01-21 MED ORDER — TRANEXAMIC ACID-NACL 1000-0.7 MG/100ML-% IV SOLN: 1000.0000 mg | INTRAVENOUS | Status: DC

## 2020-01-21 MED ORDER — METHOCARBAMOL 500 MG PO TABS
ORAL_TABLET | ORAL | Status: AC
  Administered 2020-01-21: 500 mg via ORAL
  Filled 2020-01-21: qty 1

## 2020-01-21 MED ORDER — CHLORHEXIDINE GLUCONATE 4 % EX LIQD: 60.0000 mL | Freq: Once | CUTANEOUS | Status: DC

## 2020-01-21 MED ORDER — TRANEXAMIC ACID-NACL 1000-0.7 MG/100ML-% IV SOLN
1000.0000 mg | INTRAVENOUS | Status: AC
  Administered 2020-01-21: 11:00:00 1000 mg via INTRAVENOUS
  Filled 2020-01-21: qty 100

## 2020-01-21 MED ORDER — PROMETHAZINE HCL 25 MG/ML IJ SOLN: 6.2500 mg | INTRAMUSCULAR | Status: DC | PRN

## 2020-01-21 MED ORDER — TRANEXAMIC ACID-NACL 1000-0.7 MG/100ML-% IV SOLN: 1000.0000 mg | Freq: Once | INTRAVENOUS | Status: DC

## 2020-01-21 MED ORDER — METHOCARBAMOL 500 MG PO TABS: 500.0000 mg | ORAL_TABLET | Freq: Four times a day (QID) | ORAL | Status: DC | PRN

## 2020-01-21 MED ORDER — PROPOFOL 500 MG/50ML IV EMUL
INTRAVENOUS | Status: DC | PRN
  Administered 2020-01-21: 85 ug/kg/min via INTRAVENOUS

## 2020-01-21 MED ORDER — LIDOCAINE HCL (CARDIAC) PF 100 MG/5ML IV SOSY
PREFILLED_SYRINGE | INTRAVENOUS | Status: DC | PRN
  Administered 2020-01-21: 60 mg via INTRAVENOUS
  Administered 2020-01-21: 40 mg via INTRAVENOUS

## 2020-01-21 MED ORDER — DOCUSATE SODIUM 100 MG PO CAPS: 100.0000 mg | ORAL_CAPSULE | Freq: Two times a day (BID) | ORAL | 0 refills | Status: DC

## 2020-01-21 MED ORDER — BUPIVACAINE LIPOSOME 1.3 % IJ SUSP
INTRAMUSCULAR | Status: DC | PRN
  Administered 2020-01-21: 10 mL

## 2020-01-21 MED ORDER — OXYCODONE HCL 5 MG PO TABS: 5.0000 mg | ORAL_TABLET | ORAL | Status: DC | PRN

## 2020-01-21 MED ORDER — SODIUM CHLORIDE 0.9 % IR SOLN
Status: DC | PRN
  Administered 2020-01-21: 1000 mL

## 2020-01-21 MED ORDER — POVIDONE-IODINE 10 % EX SWAB: 2.0000 "application " | Freq: Once | CUTANEOUS | Status: DC

## 2020-01-21 SURGICAL SUPPLY — 43 items
BAG ZIPLOCK 12X15 (MISCELLANEOUS) IMPLANT
BENZOIN TINCTURE PRP APPL 2/3 (GAUZE/BANDAGES/DRESSINGS) ×2 IMPLANT
BLADE SAW SGTL 18X1.27X75 (BLADE) ×2 IMPLANT
BLADE SAW SGTL 18X1.27X75MM (BLADE) ×1
BLADE SURG SZ10 CARB STEEL (BLADE) ×6 IMPLANT
CLOSURE WOUND 1/2 X4 (GAUZE/BANDAGES/DRESSINGS) ×1
COVER PERINEAL POST (MISCELLANEOUS) ×3 IMPLANT
COVER SURGICAL LIGHT HANDLE (MISCELLANEOUS) ×3 IMPLANT
COVER WAND RF STERILE (DRAPES) ×2 IMPLANT
CUP ACETBLR 52 OD 100 SERIES (Hips) ×2 IMPLANT
DECANTER SPIKE VIAL GLASS SM (MISCELLANEOUS) ×3 IMPLANT
DRAPE STERI IOBAN 125X83 (DRAPES) ×3 IMPLANT
DRAPE U-SHAPE 47X51 STRL (DRAPES) ×6 IMPLANT
DRSG AQUACEL AG ADV 3.5X 6 (GAUZE/BANDAGES/DRESSINGS) ×3 IMPLANT
DURAPREP 26ML APPLICATOR (WOUND CARE) ×3 IMPLANT
ELECT BLADE TIP CTD 4 INCH (ELECTRODE) ×3 IMPLANT
ELECT REM PT RETURN 15FT ADLT (MISCELLANEOUS) ×3 IMPLANT
ELIMINATOR HOLE APEX DEPUY (Hips) ×2 IMPLANT
GAUZE XEROFORM 1X8 LF (GAUZE/BANDAGES/DRESSINGS) IMPLANT
GLOVE BIOGEL PI IND STRL 8 (GLOVE) ×2 IMPLANT
GLOVE BIOGEL PI INDICATOR 8 (GLOVE) ×4
GLOVE ECLIPSE 7.5 STRL STRAW (GLOVE) ×6 IMPLANT
GOWN STRL REUS W/TWL XL LVL3 (GOWN DISPOSABLE) ×8 IMPLANT
HEAD CERAMIC 36 PLUS5 (Hips) ×2 IMPLANT
HOLDER FOLEY CATH W/STRAP (MISCELLANEOUS) ×3 IMPLANT
HOOD PEEL AWAY FLYTE STAYCOOL (MISCELLANEOUS) ×6 IMPLANT
KIT TURNOVER KIT A (KITS) IMPLANT
LINER ACETAB NEUTRAL 36ID 520D (Liner) ×2 IMPLANT
NEEDLE HYPO 22GX1.5 SAFETY (NEEDLE) ×3 IMPLANT
PACK ANTERIOR HIP CUSTOM (KITS) ×3 IMPLANT
PENCIL SMOKE EVACUATOR (MISCELLANEOUS) ×2 IMPLANT
STAPLER VISISTAT 35W (STAPLE) IMPLANT
STEM CORAIL KA11 (Stem) ×2 IMPLANT
STRIP CLOSURE SKIN 1/2X4 (GAUZE/BANDAGES/DRESSINGS) ×1 IMPLANT
SUT ETHIBOND NAB CT1 #1 30IN (SUTURE) ×6 IMPLANT
SUT MNCRL AB 3-0 PS2 18 (SUTURE) ×2 IMPLANT
SUT VIC AB 0 CT1 36 (SUTURE) ×3 IMPLANT
SUT VIC AB 1 CT1 36 (SUTURE) ×5 IMPLANT
SUT VIC AB 2-0 CT1 27 (SUTURE) ×2
SUT VIC AB 2-0 CT1 TAPERPNT 27 (SUTURE) IMPLANT
TRAY FOLEY MTR SLVR 14FR STAT (SET/KITS/TRAYS/PACK) ×2 IMPLANT
TRAY FOLEY MTR SLVR 16FR STAT (SET/KITS/TRAYS/PACK) ×1 IMPLANT
YANKAUER SUCT BULB TIP 10FT TU (MISCELLANEOUS) ×3 IMPLANT

## 2020-01-21 NOTE — Progress Notes (Signed)
Pt discharged in NAD, VSS, pain tolerable. Pt and husband given discharge instructions. All questions answered. Pt discharged home by wheelchair with husband.

## 2020-01-21 NOTE — Brief Op Note (Signed)
01/21/2020  12:34 PM  PATIENT:  Teresa Hopkins  69 y.o. female  PRE-OPERATIVE DIAGNOSIS:  LEFT HIP DEGENERATIVE JOINT DISEASE  POST-OPERATIVE DIAGNOSIS:  LEFT HIP DEGENERATIVE JOINT DISEASE  PROCEDURE:  Procedure(s): TOTAL HIP ARTHROPLASTY ANTERIOR APPROACH (Left)  SURGEON:  Surgeon(s) and Role:    Jodi Geralds, MD - Primary  PHYSICIAN ASSISTANT:   ASSISTANTS: jim bethune   ANESTHESIA:   spinal  EBL:  200 mL   BLOOD ADMINISTERED:none  DRAINS: none   LOCAL MEDICATIONS USED:  MARCAINE    and OTHER experel  SPECIMEN:  No Specimen  DISPOSITION OF SPECIMEN:  N/A  COUNTS:  YES  TOURNIQUET:  * No tourniquets in log *  DICTATION: .Other Dictation: Dictation Number [id number]  PLAN OF CARE: Discharge to home after PACU  PATIENT DISPOSITION:  PACU - hemodynamically stable.   Delay start of Pharmacological VTE agent (>24hrs) due to surgical blood loss or risk of bleeding: no

## 2020-01-21 NOTE — Anesthesia Preprocedure Evaluation (Addendum)
Anesthesia Evaluation  Patient identified by MRN, date of birth, ID band Patient awake    Reviewed: Allergy & Precautions, NPO status , Patient's Chart, lab work & pertinent test results  History of Anesthesia Complications Negative for: history of anesthetic complications  Airway Mallampati: II  TM Distance: >3 FB Neck ROM: Full    Dental no notable dental hx. (+) Teeth Intact, Dental Advisory Given,    Pulmonary neg pulmonary ROS, former smoker,    Pulmonary exam normal breath sounds clear to auscultation       Cardiovascular negative cardio ROS Normal cardiovascular exam Rhythm:Regular Rate:Normal     Neuro/Psych Anxiety negative neurological ROS     GI/Hepatic Neg liver ROS,   Endo/Other  negative endocrine ROS  Renal/GU negative Renal ROS  negative genitourinary   Musculoskeletal negative musculoskeletal ROS (+)   Abdominal (+)  Abdomen: soft. Bowel sounds: normal.  Peds negative pediatric ROS (+)  Hematology negative hematology ROS (+)   Anesthesia Other Findings   Reproductive/Obstetrics negative OB ROS                            Anesthesia Physical  Anesthesia Plan  ASA: II  Anesthesia Plan: Spinal   Post-op Pain Management:    Induction:   PONV Risk Score and Plan: 2 and Ondansetron and Propofol infusion  Airway Management Planned: Natural Airway  Additional Equipment:   Intra-op Plan:   Post-operative Plan:   Informed Consent: I have reviewed the patients History and Physical, chart, labs and discussed the procedure including the risks, benefits and alternatives for the proposed anesthesia with the patient or authorized representative who has indicated his/her understanding and acceptance.     Dental advisory given  Plan Discussed with: Anesthesiologist and Surgeon  Anesthesia Plan Comments:      Anesthesia Quick Evaluation

## 2020-01-21 NOTE — Op Note (Signed)
Teresa Hopkins, Teresa Hopkins MEDICAL RECORD YP:[id number] ACCOUNT [id number] DATE OF BIRTH:24-Jan-1951 FACILITY: [facility] LOCATION: [facility] PHYSICIAN:Gennett Garcia Starling Manns, MD  OPERATIVE REPORT  DATE OF PROCEDURE:  01/21/2020  PREOPERATIVE DIAGNOSIS:  End-stage degenerative joint disease, left hip.  POSTOPERATIVE DIAGNOSIS:  End-stage degenerative joint disease, left hip.  PROCEDURES:  1.   Left total hip replacement with a Corail stem size 11, a 52 mm Pinnacle porous coated cup, a 36 mm +5 hip ball, and a neutral liner to accept a 36 mm hip ball. 2.  Interpretation of multiple intraoperative fluoroscopic images.  SURGEON:  Jodi Geralds, MD  ASSISTANT:  Gus Puma, M.D. Gus Puma PA-C, was present for the entire case and assisted by manipulation of the leg, retraction of tissues, and closing to minimize OR time  BRIEF HISTORY:  The patient  is a 69 year old female with a long history of significant complaints of left hip pain.  She has been treated conservatively for a prolonged period of time with failure of all conservative care.  She was taken to the  operating room for left total hip replacement.  She was having night pain and a lot of activity pain.  She had x-rays showing bone-on-bone change.  DESCRIPTION OF PROCEDURE:  The patient was taken to the operating room.  After adequate anesthesia was obtained with a spinal anesthetic, the patient was placed supine on the operating table and moved onto the Hana bed.  All bony prominences well padded.   Attention was then turned to the left hip where after routine prep and drape, an incision was made for an anterior approach to the hip, subcutaneous tissue down to the level of the tensor fascia which was clearly identified and the fascia was opened.   The muscle was finger dissected and retractors were put in place above and below the femoral neck.  The capsule was opened and tagged.  Anterior capsular release was performed.  Provisional neck cut then  made.  Attention was then turned towards placing  retractors.  They were placed in front and back of the acetabulum.  Labrectomy was performed, followed by removal of pulvinar, then sequentially reaming the acetabulum to a level of 51 mm.  A 52 Pinnacle porous coated cup was hammered into place with 45  degrees of lateral opening and 30 degrees of anteversion.  Once that was completed, the wound was irrigated, hole eliminator was placed.  The final poly liner was placed in neutral for a 36 hip ball.  Attention was then turned towards the stem side where  the arm bar was placed, followed by extension of the hip, external rotation of the hip and adduction.  This delivered the hip up into the wound and posterior capsule was released.  Once that was done, attention was turned towards sequential opening of  the hip, with a cookie cutter, followed by a chili pepper followed by sequential broaching of the hip up to a size 11.  We went to a 12.  We got it almost down, but I felt like it was probably too tight, went back to the 11.  Calcar planed it down to  where the 11 was super-tight and then did a trial reduction, a touch bit short with the 11, went with a +5 and that gave [place] symmetric leg length.  At this point, the hip was redislocated and the final size 11 implant was placed and we trialed it again  with a +5 and felt that was going to be the best  size for stability and length.  Once that was placed, the hip was reduced, moved to neutral.  The capsule was closed with  #1 Vicryl running.  The tensor fascia was closed with 0 Vicryl running.  Exparel  was instilled throughout the subcutaneous tissues for postoperative pain control and the skin was then closed with 2-0 Vicryl and 3-0 Monocryl subcuticular.  Benzoin, Steri-Strips, dry sterile compressive dressing was applied.  The patient was taken to  recovery.  She was noted to be in satisfactory condition.  Estimated blood loss for the procedure was 200 mL.  Of  note, we did use fluoroscopy multiple times throughout the case to assess the positioning of the cup as well as size fitting of the stem and  leg length.  VN/NUANCE  D:01/21/2020 T:01/21/2020 JOB:[id number]

## 2020-01-21 NOTE — Evaluation (Addendum)
Physical Therapy Evaluation Patient Details Name: Teresa Hopkins MRN: [id number] DOB: [birth year] Today's Date: 01/21/2020   History of Present Illness  s/p L DA THA.  PMH: lum/lam  Clinical Impression  Patient evaluated by Physical Therapy with no further acute PT needs identified. All education has been completed and the patient has no further questions. See below for any follow-up Physical Therapy or equipment needs. PT is signing off. Thank you for this referral. Reviewed gait/mobility/stairs/home safety/HEP. Pt is ok to d/c with spouse assist from PT standpoint     Follow Up Recommendations Follow surgeon's recommendation for DC plan and follow-up therapies; recommend 24 hour supervision/assist initially at home  For safety and fall prevention    Equipment Recommendations  None recommended by PT(spouse acquired walker, cane)    Recommendations for Other Services       Precautions / Restrictions Precautions Precautions: Fall Restrictions Weight Bearing Restrictions: No Other Position/Activity Restrictions: WBAT      Mobility  Bed Mobility Overal bed mobility: Needs Assistance Bed Mobility: Supine to Sit;Sit to Supine     Supine to sit: Min guard Sit to supine: Min guard   General bed mobility comments: for safety  Transfers Overall transfer level: Needs assistance Equipment used: Rolling walker (2 wheeled) Transfers: Sit to/from Stand Sit to Stand: Min guard         General transfer comment: cues for safety and hand placement  Ambulation/Gait Ambulation/Gait assistance: Min guard Gait Distance (Feet): 120 Feet Assistive device: Rolling walker (2 wheeled) Gait Pattern/deviations: Step-through pattern;Decreased stride length   Gait velocity interpretation: <1.31 ft/sec, indicative of household ambulator General Gait Details: cues for sequence and RW safety  Stairs Stairs: Yes Stairs assistance: Min guard;Min assist Stair Management: Step to  pattern;Forwards;With cane;One rail Left Number of Stairs: 3 General stair comments: cues  for sequence and safe technique  Wheelchair Mobility    Modified Rankin (Stroke Patients Only)       Balance                                             Pertinent Vitals/Pain Pain Assessment: 0-10 Pain Score: 3  Pain Location: L hip Pain Descriptors / Indicators: Aching;Grimacing;Constant Pain Intervention(s): Limited activity within patient's tolerance;Monitored during session;Premedicated before session;Repositioned    Home Living Family/patient expects to be discharged to:: Private residence Living Arrangements: Spouse/significant other Available Help at Discharge: Family Type of Home: House Home Access: Stairs to enter Entrance Stairs-Rails: Right Entrance Stairs-Number of Steps: 2 Home Layout: Two level;Able to live on main level with bedroom/bathroom Home Equipment: None      Prior Function Level of Independence: Independent               Hand Dominance        Extremity/Trunk Assessment   Upper Extremity Assessment Upper Extremity Assessment: Overall WFL for tasks assessed    Lower Extremity Assessment Lower Extremity Assessment: LLE deficits/detail LLE Deficits / Details: knee and hip grossly 2+/5 to 3/5. ankle WFL, tends to invert foot       Communication   Communication: No difficulties  Cognition Arousal/Alertness: Awake/alert Behavior During Therapy: WFL for tasks assessed/performed Overall Cognitive Status: Within Functional Limits for tasks assessed  General Comments      Exercises Total Joint Exercises Ankle Circles/Pumps: AROM;Both;10 reps Quad Sets: AROM;Both;10 reps Heel Slides: AROM;Left;10 reps Hip ABduction/ADduction: AROM;Left;10 reps   Assessment/Plan    PT Assessment Patient needs continued PT services  PT Problem List         PT Treatment  Interventions      PT Goals (Current goals can be found in the Care Plan section)  Acute Rehab PT Goals Patient Stated Goal: home today PT Goal Formulation: With patient Time For Goal Achievement: 01/28/20 Potential to Achieve Goals: Good    Frequency     Barriers to discharge        Co-evaluation               AM-PAC PT "6 Clicks" Mobility  Outcome Measure Help needed turning from your back to your side while in a flat bed without using bedrails?: None Help needed moving from lying on your back to sitting on the side of a flat bed without using bedrails?: A Little Help needed moving to and from a bed to a chair (including a wheelchair)?: A Little Help needed standing up from a chair using your arms (e.g., wheelchair or bedside chair)?: A Little Help needed to walk in hospital room?: A Little Help needed climbing 3-5 steps with a railing? : A Little 6 Click Score: 19    End of Session Equipment Utilized During Treatment: Gait belt Activity Tolerance: Patient tolerated treatment well Patient left: in bed;with call bell/phone within reach Nurse Communication: Mobility status PT Visit Diagnosis: Difficulty in walking, not elsewhere classified (R26.2)    Time: 1700-1730 PT Time Calculation (min) (ACUTE ONLY): 30 min   Charges:   PT Evaluation $PT Eval Low Complexity: 1 Low PT Treatments $Gait Training: 8-22 mins        Baxter Flattery, PT   Acute Rehab Dept [facility]): [phone]   01/21/2020   [facility] 01/21/2020, 5:39 PM

## 2020-01-21 NOTE — Anesthesia Postprocedure Evaluation (Signed)
Anesthesia Post Note  Patient: Teresa Hopkins  Procedure(s) Performed: TOTAL HIP ARTHROPLASTY ANTERIOR APPROACH (Left Hip)     Patient location during evaluation: PACU Anesthesia Type: Spinal Level of consciousness: awake and alert Pain management: pain level controlled Vital Signs Assessment: post-procedure vital signs reviewed and stable Respiratory status: spontaneous breathing and respiratory function stable Cardiovascular status: blood pressure returned to baseline and stable Postop Assessment: spinal receding Anesthetic complications: no    Last Vitals:  Vitals:   01/21/20 1345 01/21/20 1400  BP: 132/71 138/80  Pulse: 62 (!) 49  Resp: 11 14  Temp:  (!) 36.4 C  SpO2: 100% 100%    Last Pain:  Vitals:   01/21/20 1400  TempSrc:   PainSc: 0-No pain                 [name]

## 2020-01-21 NOTE — Discharge Instructions (Signed)

## 2020-01-21 NOTE — H&P (Signed)
TOTAL HIP ADMISSION H&P  Patient is admitted for left total hip arthroplasty.  Subjective:  Chief Complaint: left hip pain  HPI: Teresa Hopkins, 69 y.o. female, has a history of pain and functional disability in the left hip(s) due to arthritis and patient has failed non-surgical conservative treatments for greater than 12 weeks to include NSAID's and/or analgesics, flexibility and strengthening excercises, use of assistive devices and activity modification.  Onset of symptoms was gradual starting 4 years ago with gradually worsening course since that time.The patient noted no past surgery on the left hip(s).  Patient currently rates pain in the left hip at 9 out of 10 with activity. Patient has night pain, worsening of pain with activity and weight bearing, trendelenberg gait, pain that interfers with activities of daily living, pain with passive range of motion and joint swelling. Patient has evidence of subchondral cysts, subchondral sclerosis, periarticular osteophytes and joint space narrowing by imaging studies. This condition presents safety issues increasing the risk of falls. This patient has had Failure of all reasonable conservative care.  There is no current active infection.  Patient Active Problem List   Diagnosis Date Noted  . HNP (herniated nucleus pulposus), lumbar 06/12/2015   Past Medical History:  Diagnosis Date  . Anxiety   . Chronic back pain   . Depression   . Gastritis   . Hyperlipidemia     Past Surgical History:  Procedure Laterality Date  . BACK SURGERY     microdiskectomy - 03/2015  . BUNIONECTOMY Right   . fallopian tube and ovary surgery    . LUMBAR LAMINECTOMY/DECOMPRESSION MICRODISCECTOMY N/A 06/12/2015   Procedure: redo L45 microdiskectomy;  Surgeon: Coletta Memos, MD;  Location: [facility];  Service: Neurosurgery;  Laterality: N/A;  redo L45 microdiskectomy    Current Facility-Administered Medications  Medication Dose Route Frequency Provider Last Rate  Last Admin  . bupivacaine liposome (EXPAREL) 1.3 % injection 133 mg  10 mL Other Once Jodi Geralds, MD       Current Outpatient Medications  Medication Sig Dispense Refill Last Dose  . FLUoxetine (PROZAC) 40 MG capsule Take 40 mg by mouth daily.      Marland Kitchen LORazepam (ATIVAN) 1 MG tablet Take 1 mg by mouth 4 (four) times daily as needed (anxiety).      . pantoprazole (PROTONIX) 40 MG tablet Take 40 mg by mouth daily.     . simvastatin (ZOCOR) 20 MG tablet Take 20 mg by mouth daily at 6 PM.      . tretinoin (RETIN-A) 0.1 % cream Apply 1 application topically at bedtime.       Allergies  Allergen Reactions  . Lactose Intolerance (Gi) Other (See Comments)    Stomach aches    Social History   Tobacco Use  . Smoking status: Former Smoker    Quit date: 06/08/2000    Years since quitting: 19.6  . Smokeless tobacco: Never Used  Substance Use Topics  . Alcohol use: Yes    Comment: occasional    Family History  Problem Relation Age of Onset  . Alzheimer's disease Mother   . Heart attack Father      Review of Systems ROS: I have reviewed the patient's review of systems thoroughly and there are no positive responses as relates to the HPI. Objective:  Physical Exam  Vital signs in last 24 hours:   Well-developed well-nourished patient in no acute distress. Alert and oriented x3 HEENT:within normal limits Cardiac: Regular rate and rhythm Pulmonary: Lungs  clear to auscultation Abdomen: Soft and nontender.  Normal active bowel sounds  Musculoskeletal: (Left hip: Painful range of motion.  Limited range of motion.  No instability.  Neurovascular intact distally. Labs: Recent Results (from the past 2160 hour(s))  APTT     Status: None   Collection Time: 01/14/20  1:55 PM  Result Value Ref Range   aPTT 24 24 - 36 seconds    Comment: Performed at [facility], [address], [place], [place] [address]  CBC WITH DIFFERENTIAL     Status: Abnormal   Collection Time:  01/14/20  1:55 PM  Result Value Ref Range   WBC 5.8 4.0 - 10.5 K/uL   RBC 3.95 3.87 - 5.11 MIL/uL   Hemoglobin 13.4 12.0 - 15.0 g/dL   HCT 96.2 22.9 - 79.8 %   MCV 103.8 (H) 80.0 - 100.0 fL   MCH 33.9 26.0 - 34.0 pg   MCHC 32.7 30.0 - 36.0 g/dL   RDW 92.1 19.4 - 17.4 %   Platelets 236 150 - 400 K/uL   nRBC 0.0 0.0 - 0.2 %   Neutrophils Relative % 39 %   Neutro Abs 2.3 1.7 - 7.7 K/uL   Lymphocytes Relative 49 %   Lymphs Abs 2.9 0.7 - 4.0 K/uL   Monocytes Relative 10 %   Monocytes Absolute 0.6 0.1 - 1.0 K/uL   Eosinophils Relative 1 %   Eosinophils Absolute 0.0 0.0 - 0.5 K/uL   Basophils Relative 1 %   Basophils Absolute 0.0 0.0 - 0.1 K/uL   Immature Granulocytes 0 %   Abs Immature Granulocytes 0.02 0.00 - 0.07 K/uL    Comment: Performed at [facility], [address], [place], [place] [address]  Comprehensive metabolic panel     Status: None   Collection Time: 01/14/20  1:55 PM  Result Value Ref Range   Sodium 143 135 - 145 mmol/L   Potassium 4.0 3.5 - 5.1 mmol/L   Chloride 106 98 - 111 mmol/L   CO2 26 22 - 32 mmol/L   Glucose, Bld 76 70 - 99 mg/dL    Comment: Glucose reference range applies only to samples taken after fasting for at least 8 hours.   BUN 19 8 - 23 mg/dL   Creatinine, Ser 8.18 0.44 - 1.00 mg/dL   Calcium 9.3 8.9 - 56.3 mg/dL   Total Protein 7.4 6.5 - 8.1 g/dL   Albumin 4.0 3.5 - 5.0 g/dL   AST 20 15 - 41 U/L   ALT 18 0 - 44 U/L   Alkaline Phosphatase 55 38 - 126 U/L   Total Bilirubin 0.8 0.3 - 1.2 mg/dL   GFR calc non Af Amer >60 >60 mL/min   GFR calc Af Amer >60 >60 mL/min   Anion gap 11 5 - 15    Comment: Performed at [facility], [address], [place], [place] [address]  Protime-INR     Status: None   Collection Time: 01/14/20  1:55 PM  Result Value Ref Range   Prothrombin Time 12.4 11.4 - 15.2 seconds   INR 0.9 0.8 - 1.2    Comment: (NOTE) INR goal varies based on device and disease states. Performed at  [facility], [address] [place], [place] [address]   Type and screen Order type and screen if day of surgery is less than 15 days from draw of preadmission visit or order morning of surgery if day of surgery  is greater than 6 days from preadmission visit.     Status: None   Collection Time: 01/14/20  1:55 PM  Result Value Ref Range   ABO/RH(D) A POS    Antibody Screen NEG    Sample Expiration 01/28/2020,2359    Extend sample reason      NO TRANSFUSIONS OR PREGNANCY IN THE PAST 3 MONTHS Performed at [facility] [address], [place], [place] [address]   Urinalysis, Routine w reflex microscopic     Status: None   Collection Time: 01/14/20  1:55 PM  Result Value Ref Range   Color, Urine YELLOW YELLOW   APPearance CLEAR CLEAR   Specific Gravity, Urine 1.015 1.005 - 1.030   pH 6.0 5.0 - 8.0   Glucose, UA NEGATIVE NEGATIVE mg/dL   Hgb urine dipstick NEGATIVE NEGATIVE   Bilirubin Urine NEGATIVE NEGATIVE   Ketones, ur NEGATIVE NEGATIVE mg/dL   Protein, ur NEGATIVE NEGATIVE mg/dL   Nitrite NEGATIVE NEGATIVE   Leukocytes,Ua NEGATIVE NEGATIVE    Comment: Performed at [facility], [place] [address], [place], [place] [address]  Surgical pcr screen     Status: None   Collection Time: 01/14/20  1:55 PM   Specimen: Nasal Swab  Result Value Ref Range   MRSA, PCR NEGATIVE NEGATIVE   Staphylococcus aureus NEGATIVE NEGATIVE    Comment: (NOTE) The Xpert SA Assay (FDA approved for NASAL specimens in patients 32 years of age and older), is one component of a comprehensive surveillance program. It is not intended to diagnose infection nor to guide or monitor treatment. Performed at [facility], [place] [address], [place], [place] [address]   ABO/Rh     Status: None   Collection Time: 01/14/20  1:55 PM  Result Value Ref Range   ABO/RH(D)      A POS Performed at [facility], [place] [address] [place], [place] [address]   SARS CORONAVIRUS 2 (TAT 6-24 HRS) Nasopharyngeal Nasopharyngeal Swab     Status: None   Collection Time: 01/18/20 10:44 AM   Specimen: Nasopharyngeal Swab  Result Value Ref Range   SARS Coronavirus 2 NEGATIVE NEGATIVE    Comment: (NOTE) SARS-CoV-2 target nucleic acids are NOT DETECTED. The SARS-CoV-2 RNA is generally detectable in upper and lower respiratory specimens during the acute phase of infection. Negative results do not preclude SARS-CoV-2 infection, do not rule out co-infections with other pathogens, and should not be used as the sole basis for treatment or other patient management decisions. Negative results must be combined with clinical observations, patient history, and epidemiological information. The expected result is Negative. Fact Sheet for Patients: SugarRoll.be Fact Sheet for Healthcare Providers: https://www.woods-mathews.com/ This test is not yet approved or cleared by the [place] FDA and  has been authorized for detection and/or diagnosis of SARS-CoV-2 by FDA under an Emergency Use Authorization (EUA). This EUA will remain  in effect (meaning this test can be used) for the duration of the COVID-19 declaration under Section 56 4(b)(1) of the Act, 21 U.S.C. section 360bbb-3(b)(1), unless the authorization is terminated or revoked sooner. Performed at [facility], [place] [address] [place],  [address]     Estimated body mass index is 28.19 kg/m as calculated from the following:   Height as of 01/14/20: 5\' 7"  (1.702 m).   Weight as of 01/14/20: 81.6 kg.   Imaging Review Plain radiographs demonstrate severe degenerative joint disease of the left hip(s). The bone quality  appears to be fair for age and reported activity level.      Assessment/Plan:  End stage arthritis, left hip(s)  The patient history, physical examination, clinical judgement of the provider and  imaging studies are consistent with end stage degenerative joint disease of the left hip(s) and total hip arthroplasty is deemed medically necessary. The treatment options including medical management, injection therapy, arthroscopy and arthroplasty were discussed at length. The risks and benefits of total hip arthroplasty were presented and reviewed. The risks due to aseptic loosening, infection, stiffness, dislocation/subluxation,  thromboembolic complications and other imponderables were discussed.  The patient acknowledged the explanation, agreed to proceed with the plan and consent was signed. Patient is being admitted for inpatient treatment for surgery, pain control, PT, OT, prophylactic antibiotics, VTE prophylaxis, progressive ambulation and ADL's and discharge planning.The patient is planning to be discharged home with home health services

## 2020-01-21 NOTE — Transfer of Care (Signed)
Immediate Anesthesia Transfer of Care Note  Patient: Teresa Hopkins  Procedure(s) Performed: Procedure(s): TOTAL HIP ARTHROPLASTY ANTERIOR APPROACH (Left)  Patient Location: PACU  Anesthesia Type:Spinal  Level of Consciousness:  sedated, patient cooperative and responds to stimulation  Airway & Oxygen Therapy:Patient Spontanous Breathing and Patient connected to face mask oxgen  Post-op Assessment:  Report given to PACU RN and Post -op Vital signs reviewed and stable  Post vital signs:  Reviewed and stable  Last Vitals:  Vitals:   01/21/20 1004 01/21/20 1255  BP: 136/64 (!) 113/53  Pulse: 65 73  Resp: 16 15  Temp: 36.9 C (!) 36.4 C  SpO2: 99% 100%    Complications: No apparent anesthesia complications

## 2020-01-25 ENCOUNTER — Encounter: Payer: Self-pay | Admitting: *Deleted

## 2020-11-08 DIAGNOSIS — R109 Unspecified abdominal pain: Secondary | ICD-10-CM | POA: Diagnosis not present

## 2020-11-08 DIAGNOSIS — R197 Diarrhea, unspecified: Secondary | ICD-10-CM | POA: Diagnosis not present

## 2020-11-08 DIAGNOSIS — J019 Acute sinusitis, unspecified: Secondary | ICD-10-CM | POA: Diagnosis not present

## 2020-11-15 DIAGNOSIS — R197 Diarrhea, unspecified: Secondary | ICD-10-CM | POA: Diagnosis not present

## 2020-12-04 ENCOUNTER — Ambulatory Visit: Payer: Medicare Other | Admitting: Nurse Practitioner

## 2020-12-18 ENCOUNTER — Other Ambulatory Visit (INDEPENDENT_AMBULATORY_CARE_PROVIDER_SITE_OTHER): Payer: Medicare Other

## 2020-12-18 ENCOUNTER — Other Ambulatory Visit: Payer: Self-pay

## 2020-12-18 ENCOUNTER — Encounter: Payer: Self-pay | Admitting: Nurse Practitioner

## 2020-12-18 ENCOUNTER — Ambulatory Visit (INDEPENDENT_AMBULATORY_CARE_PROVIDER_SITE_OTHER): Payer: Medicare Other | Admitting: Nurse Practitioner

## 2020-12-18 VITALS — BP 118/62 | HR 74 | Ht 66.0 in | Wt 176.0 lb

## 2020-12-18 DIAGNOSIS — R197 Diarrhea, unspecified: Secondary | ICD-10-CM | POA: Diagnosis not present

## 2020-12-18 DIAGNOSIS — R1030 Lower abdominal pain, unspecified: Secondary | ICD-10-CM | POA: Diagnosis not present

## 2020-12-18 LAB — COMPREHENSIVE METABOLIC PANEL
ALT: 16 U/L (ref 0–35)
AST: 18 U/L (ref 0–37)
Albumin: 4.4 g/dL (ref 3.5–5.2)
Alkaline Phosphatase: 60 U/L (ref 39–117)
BUN: 12 mg/dL (ref 6–23)
CO2: 25 mEq/L (ref 19–32)
Calcium: 9.7 mg/dL (ref 8.4–10.5)
Chloride: 104 mEq/L (ref 96–112)
Creatinine, Ser: 0.78 mg/dL (ref 0.40–1.20)
GFR: 77.43 mL/min (ref >60.00)
Glucose, Bld: 99 mg/dL (ref 70–99)
Potassium: 3.8 mEq/L (ref 3.5–5.1)
Sodium: 140 mEq/L (ref 135–145)
Total Bilirubin: 0.5 mg/dL (ref 0.2–1.2)
Total Protein: 7.7 g/dL (ref 6.0–8.3)

## 2020-12-18 LAB — CBC WITH DIFFERENTIAL/PLATELET
Basophils Absolute: 0 10*3/uL (ref 0.0–0.1)
Basophils Relative: 0.4 % (ref 0.0–3.0)
Eosinophils Absolute: 0 10*3/uL (ref 0.0–0.7)
Eosinophils Relative: 0.6 % (ref 0.0–5.0)
HCT: 38.8 % (ref 36.0–46.0)
Hemoglobin: 13.3 g/dL (ref 12.0–15.0)
Lymphocytes Relative: 38 % (ref 12.0–46.0)
Lymphs Abs: 2.8 10*3/uL (ref 0.7–4.0)
MCHC: 34.3 g/dL (ref 30.0–36.0)
MCV: 101.2 fl — ABNORMAL HIGH (ref 78.0–100.0)
Monocytes Absolute: 0.6 10*3/uL (ref 0.1–1.0)
Monocytes Relative: 7.7 % (ref 3.0–12.0)
Neutro Abs: 3.9 10*3/uL (ref 1.4–7.7)
Neutrophils Relative %: 53.3 % (ref 43.0–77.0)
Platelets: 254 10*3/uL (ref 150.0–400.0)
RBC: 3.84 Mil/uL — ABNORMAL LOW (ref 3.87–5.11)
RDW: 12.5 % (ref 11.5–15.5)
WBC: 7.3 10*3/uL (ref 4.0–10.5)

## 2020-12-18 LAB — C-REACTIVE PROTEIN: CRP: <1 mg/dL (ref 0.5–20.0)

## 2020-12-18 LAB — TSH: TSH: 2.14 u[IU]/mL (ref 0.35–4.50)

## 2020-12-18 NOTE — Patient Instructions (Signed)
If you are age 70 or older, your body mass index should be between 23-30. Your Body mass index is 28.41 kg/m. If this is out of the aforementioned range listed, please consider follow up with your Primary Care Provider.  If you are age 1 or younger, your body mass index should be between 19-25. Your Body mass index is 28.41 kg/m. If this is out of the aformentioned range listed, please consider follow up with your Primary Care Provider.   Your provider has requested that you go to the basement level for lab work before leaving today. Press "B" on the elevator. The lab is located at the first door on the left as you exit the elevator.  Please obtain copy of your most recent colonoscopy report.   Follow up in 4-6 weeks. Call office if symptoms worsen.

## 2020-12-18 NOTE — Progress Notes (Signed)
12/18/2020 Teresa Hopkins [id number] 07/12/1951   CHIEF COMPLAINT: Diarrhea   HISTORY OF PRESENT ILLNESS:  Teresa Hopkins is a 70 year old female with a past medical history of anxiety, depression, arthritis and hyperlipidemia. 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  She presents to our office today as referred by Dr. Abigail Miyamoto for further evaluation regarding diarrhea which started 3 months ago.  She was previously followed [facility] but she could not be seen until mid February or March therefore she was referred to our practice for further GI evaluation.  She reports passing 1 watery to loose nonbloody diarrhea bowel movement once daily.  She was prescribed a Z-Pak and oral steroids for sinus infection 3 or 4 months ago.  She completed the 5-day course of Azithromycin and the next day she was prescribed Amoxicillin for 10 days.  She stated her diarrhea started after the first day on Amoxicillin and has continued since then.  She was prescribed a probiotic and Hyoscyamine early Jan. 2022 and her diarrhea resolved for 1 week, she was passing softly formed bowel movements at that time.  However, by the eighth day her diarrhea recurred.  She allso describes having generalized lower abdominal pain which occurs most days which comes and goes.  No severe abdominal pain.  No weight loss.  No fever, sweats or chills.  She infrequently takes Imodium.  She underwent C. difficile toxin a and B testing on 11/15/2020 which was negative.  She reports having a colonoscopy approximately 3 years ago, cannot recall where she had this colonoscopy done.  I will request a copy of her most recent colonoscopy from her PCPs office.  Infrequent NSAID use.  She is lactose intolerant.  No known family history of IBD or colorectal cancer.  She is tearful.  She questions if an abdominal/pelvic CT scan should be  done.   CBC Latest Ref Rng & Units 12/18/2020 01/14/2020 06/21/2016  WBC 4.0 - 10.5 K/uL 7.3 5.8 5.9  Hemoglobin 12.0 - 15.0 g/dL 31.4 97.0 26.3  Hematocrit 36.0 - 46.0 % 38.8 41.0 39.6  Platelets 150.0 - 400.0 K/uL 254.0 236 235    CMP Latest Ref Rng & Units 12/18/2020 01/14/2020 06/21/2016  Glucose 70 - 99 mg/dL 99 76 785(Y)  BUN 6 - 23 mg/dL 12 19 19   Creatinine 0.40 - 1.20 mg/dL 8.50 2.77  Sodium 135 - 145 mEq/L 140 143 140  Potassium 3.5 - 5.1 mEq/L 3.8 4.0 3.6  Chloride 96 - 112 mEq/L 104 106 108  CO2 19 - 32 mEq/L 25 26 24   Calcium 8.4 - 10.5 mg/dL 9.7 9.3 9.4  Total Protein 6.0 - 8.3 g/dL 7.7 7.4 7.0  Total Bilirubin 0.2 - 1.2 mg/dL 0.5 0.8 0.5  Alkaline Phos 39 - 117 U/L 60 55 56  AST 0 - 37 U/L 18 20 24   ALT 0 - 35 U/L 16 18 22      Past Medical History:  Diagnosis Date  . Anxiety   . Chronic back pain   . Depression   . Gastritis   . Hyperlipidemia    Past Surgical History:  Procedure Laterality Date  . BACK SURGERY     microdiskectomy - 03/2015  . BUNIONECTOMY Right   . fallopian tube and ovary surgery    . LUMBAR LAMINECTOMY/DECOMPRESSION MICRODISCECTOMY N/A 06/12/2015   Procedure: redo L45 microdiskectomy;  Surgeon: , MD;  Location: [facility]  NEURO ORS;  Service: Neurosurgery;  Laterality: N/A;  redo L45 microdiskectomy  . TOTAL HIP ARTHROPLASTY Left 01/21/2020   Procedure: TOTAL HIP ARTHROPLASTY ANTERIOR APPROACH;  Surgeon: Jodi Geralds, MD;  Location: [facility] ORS;  Service: Orthopedics;  Laterality: Left;   Social History: Smoked cigarettes on and off for 30 years. Quit smoking 20 yeas ago. She previously drank one glass of wine socially. No alcohol past 4 to 5 months.   Family History: Mother died 91 Alzheimer's disease. Father died age 37 with history of MI, prostate cancer.  Brother died kidney or liver cancer. Brother and Sister are both healthy.   Allergies  Allergen Reactions  . Lactose Intolerance (Gi) Other (See Comments)    Stomach aches       Outpatient Encounter Medications as of 12/18/2020  Medication Sig  . FLUoxetine (PROZAC) 40 MG capsule Take 40 mg by mouth daily.   . hyoscyamine (LEVSIN) 0.125 MG tablet 1-2 TABLET AS NEEDED FOR ABDMINAL CRAMPS ORALLY EVERY 6 HRS AS NEEDED FOR ABDOMINAL CRAMPING  . LORazepam (ATIVAN) 1 MG tablet Take 1 mg by mouth 4 (four) times daily as needed (anxiety).   . Saccharomyces boulardii (PROBIOTIC) 250 MG CAPS Take 1 capsule by mouth daily.  . simvastatin (ZOCOR) 20 MG tablet Take 20 mg by mouth daily at 6 PM.   . tretinoin (RETIN-A) 0.1 % cream Apply 1 application topically at bedtime.   . [DISCONTINUED] aspirin EC 325 MG tablet Take 1 tablet (325 mg total) by mouth 2 (two) times daily after a meal. Take x 1 month post op to decrease risk of blood clots.  . [DISCONTINUED] docusate sodium (COLACE) 100 MG capsule Take 1 capsule (100 mg total) by mouth 2 (two) times daily.  . [DISCONTINUED] oxyCODONE-acetaminophen (PERCOCET/ROXICET) 5-325 MG tablet Take 1-2 tablets by mouth every 6 (six) hours as needed for severe pain.  . [DISCONTINUED] pantoprazole (PROTONIX) 40 MG tablet Take 40 mg by mouth daily.  . [DISCONTINUED] tiZANidine (ZANAFLEX) 2 MG tablet Take 1 tablet (2 mg total) by mouth every 8 (eight) hours as needed for muscle spasms.   No facility-administered encounter medications on file as of 12/18/2020.    REVIEW OF SYSTEMS:   Gen: Denies fever, sweats or chills. No weight loss.  CV: Denies chest pain, palpitations or edema. Resp: Denies cough, shortness of breath of hemoptysis.  GI: The HPI. GU : Denies urinary burning, blood in urine, increased urinary frequency or incontinence. MS: Denies joint pain, muscles aches or weakness. Derm: Denies rash, itchiness, skin lesions or unhealing ulcers. Psych:+ Anxiety and depression. Heme: Denies bruising, bleeding. Neuro:  Denies headaches, dizziness or paresthesias. Endo:  Denies any problems with DM, thyroid or adrenal function.  PHYSICAL  EXAM: BP 118/62   Pulse 74   Ht 5\' 6"  (1.676 m)   Wt 176 lb (79.8 kg)   BMI 28.41 kg/m  General: 70 year old female in no acute distress. Head: Normocephalic and atraumatic. Eyes:  Sclerae non-icteric, conjunctive pink. Ears: Normal auditory acuity. Mouth: Dentition intact. No ulcers or lesions.  Neck: Supple, no lymphadenopathy or thyromegaly.  Lungs: Clear bilaterally to auscultation without wheezes, crackles or rhonchi. Heart: Regular rate and rhythm. No murmur, rub or gallop appreciated.  Abdomen: Soft, nontender, non distended. No masses. No hepatosplenomegaly. Normoactive bowel sounds x 4 quadrants.  Rectal: Deferred.  Musculoskeletal: Symmetrical with no gross deformities. Skin: Warm and dry. No rash or lesions on visible extremities. Extremities: No edema. Neurological: Alert oriented x 4, no focal deficits.  Psychological:  Alert and cooperative. Normal mood and affect.  ASSESSMENT AND PLAN:  14.  70 year old female with nonbloody diarrhea x3 months with intermittent lower abdominal pain.  C. difficile toxin a and B test 11/15/2020 was negative. -GI pathogen panel, fecal calprotectin, TTG, IgA, CRP, TSH, CMP and CBC -Continue probiotic of choice once daily -Hyoscyamine as needed -Further recommendations to be determined after the above laboratory results reviewed -I discussed scheduling abdominal/pelvic CT scan if GI pathogen panel negative and if symptoms persist.  Await the above laboratory results. -Patient to call our office if her symptoms worsen -I discussed scheduling a  diagnostic colonoscopy to rule out microscopic colitis/IBD  if the above evaluation unrevealing, she does not wish to pursue a colonoscopy at this time. -Follow-up in the office in 4 to 6-week -Patient to call our office if her diarrhea or abdominal pain worsens  2.  Colon cancer screen.  It is unclear when she last had a colonoscopy, possibly 3 years ago. -Request a copy of her most recent  colonoscopy procedure and biopsy results from her PCP         CC:  [place], Betty G, MD

## 2020-12-19 LAB — IGA: Immunoglobulin A: 346 mg/dL — ABNORMAL HIGH (ref 70–320)

## 2020-12-19 LAB — TISSUE TRANSGLUTAMINASE ABS,IGG,IGA
(tTG) Ab, IgA: <1 U/mL
(tTG) Ab, IgG: <1 U/mL

## 2020-12-19 NOTE — Progress Notes (Signed)
____________________________________________________________  Attending physician addendum:  Thank you for sending this case to me. I have reviewed the entire note.  If blood testing and GI pathogen panel unrevealing, my advice is a CT scan and colonoscopy.  Amada Jupiter, MD  ____________________________________________________________

## 2020-12-25 ENCOUNTER — Other Ambulatory Visit: Payer: Medicare Other

## 2020-12-25 DIAGNOSIS — R197 Diarrhea, unspecified: Secondary | ICD-10-CM | POA: Diagnosis not present

## 2020-12-25 DIAGNOSIS — R1030 Lower abdominal pain, unspecified: Secondary | ICD-10-CM

## 2020-12-28 LAB — GI PROFILE, STOOL, PCR

## 2020-12-28 LAB — CALPROTECTIN, FECAL: Calprotectin, Fecal: 80 ug/g (ref 0–120)

## 2020-12-29 ENCOUNTER — Other Ambulatory Visit: Payer: Self-pay | Admitting: Nurse Practitioner

## 2020-12-29 DIAGNOSIS — R1030 Lower abdominal pain, unspecified: Secondary | ICD-10-CM

## 2020-12-29 DIAGNOSIS — R197 Diarrhea, unspecified: Secondary | ICD-10-CM

## 2021-01-02 ENCOUNTER — Telehealth: Payer: Self-pay | Admitting: Nurse Practitioner

## 2021-01-02 NOTE — Telephone Encounter (Signed)
Chart reviewed, RN placed order but I do not see documentation on if it was sent to schedulers or not.  Will send now and they will contact patient.

## 2021-01-02 NOTE — Telephone Encounter (Signed)
Inbound call from patient requesting a call back please in regards to scheduling her CT scan.

## 2021-01-04 ENCOUNTER — Other Ambulatory Visit: Payer: Self-pay

## 2021-01-04 ENCOUNTER — Ambulatory Visit (HOSPITAL_COMMUNITY)
Admission: RE | Admit: 2021-01-04 | Discharge: 2021-01-04 | Disposition: A | Discharge status: Home / Self Care | Payer: Medicare Other | Source: Ambulatory Visit | Attending: Nurse Practitioner | Admitting: Nurse Practitioner

## 2021-01-04 ENCOUNTER — Encounter (HOSPITAL_COMMUNITY): Payer: Self-pay

## 2021-01-04 DIAGNOSIS — R109 Unspecified abdominal pain: Secondary | ICD-10-CM | POA: Diagnosis not present

## 2021-01-04 DIAGNOSIS — R1030 Lower abdominal pain, unspecified: Secondary | ICD-10-CM | POA: Diagnosis not present

## 2021-01-04 DIAGNOSIS — R197 Diarrhea, unspecified: Secondary | ICD-10-CM | POA: Insufficient documentation

## 2021-01-04 MED ORDER — IOHEXOL 300 MG/ML  SOLN
100.0000 mL | Freq: Once | INTRAMUSCULAR | Status: AC | PRN
  Administered 2021-01-04: 100 mL via INTRAVENOUS

## 2021-01-09 NOTE — Progress Notes (Signed)
01/09/2021 Teresa Hopkins [id number] 04/06/1951   Chief Complaint: Diarrhea follow-up  History of Present Illness:  Teresa Hopkins is a 70 year old female with a past medical history of anxiety, depression, arthritis and hyperlipidemia. 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   I initially saw the patient in office on 12/18/2020 for further evaluation regarding diarrhea x 3 months, refer to this office consult note for comprehensive history review.  A GI pathogen panel including C. Diff PCR was negative. Fecal calprotectin was normal. CTAP 01/05/2021 showed a normal stomach, small bowel and the cecum was identified in the right mid abdomen otherwise the colon was normal. A tiny lesion was seen centrally in the uterus. A colonoscopy was recommended for further evaluation. She presents today for her scheduled follow-up appointment.  Her diarrhea stopped about 2 weeks ago.  He is passing a soft formed bowel movement once or twice daily.  She has been taking a probiotic for the past 30 days.  She is no longer requiring Hyoscyamine.  Dr. Myrtie Neither previously reviewed her laboratory study and CTAP results and he recommended scheduling a diagnostic colonoscopy.  However, she does not wish to pursue a colonoscopy at this time.  She reported having a colonoscopy completed 3 years ago but she cannot recall where she had it done, no significant findings.  I requested a copy of her colonoscopy report from her PCPs office but I have not received these records.  I asked her to obtain a copy of her colonoscopy report for my review.  She complains of having infrequent acid reflux.  She describes her reflux symptoms as a burning throat sensation without classic esophageal heartburn.  No dysphagia.  Reports eating a fairly healthy high-fiber diet and exercises on a regular basis.  No alcohol use.  Previously took  pantoprazole 40 mg daily for 4 weeks and her reflux symptoms improved.  She denies ever having an EGD.  No other complaints at this time.  CBC Latest Ref Rng & Units 12/18/2020 01/14/2020 06/21/2016  WBC 4.0 - 10.5 K/uL 7.3 5.8 5.9  Hemoglobin 12.0 - 15.0 g/dL 40.9 81.1 91.4  Hematocrit 36.0 - 46.0 % 38.8 41.0 39.6  Platelets 150.0 - 400.0 K/uL 254.0 236 235  MCV 101.2. TSH 2.14. IgA 346. tTG IgA ab < 10. tTG IgG ab < 10.   CMP Latest Ref Rng & Units 12/18/2020 01/14/2020 06/21/2016  Glucose 70 - 99 mg/dL 99 76 782(N)  BUN 6 - 23 mg/dL 12 19 19   Creatinine 0.40 - 1.20 mg/dL 5.62 1.30  Sodium 135 - 145 mEq/L 140 143 140  Potassium 3.5 - 5.1 mEq/L 3.8 4.0 3.6  Chloride 96 - 112 mEq/L 104 106 108  CO2 19 - 32 mEq/L 25 26 24   Calcium 8.4 - 10.5 mg/dL 9.7 9.3 9.4  Total Protein 6.0 - 8.3 g/dL 7.7 7.4 7.0  Total Bilirubin 0.2 - 1.2 mg/dL 0.5 0.8 0.5  Alkaline Phos 39 - 117 U/L 60 55 56  AST 0 - 37 U/L 18 20 24   ALT 0 - 35 U/L 16 18 22    Current Outpatient Medications on File Prior to Visit  Medication Sig Dispense Refill  . FLUoxetine (PROZAC) 40 MG capsule Take 40 mg by mouth daily.     . hyoscyamine (LEVSIN) 0.125 MG tablet 1-2 TABLET AS NEEDED FOR ABDMINAL CRAMPS ORALLY EVERY 6 HRS AS NEEDED FOR ABDOMINAL CRAMPING    .  LORazepam (ATIVAN) 1 MG tablet Take 1 mg by mouth 4 (four) times daily as needed (anxiety).     . Saccharomyces boulardii (PROBIOTIC) 250 MG CAPS Take 1 capsule by mouth daily.    . simvastatin (ZOCOR) 20 MG tablet Take 20 mg by mouth daily at 6 PM.     . tretinoin (RETIN-A) 0.1 % cream Apply 1 application topically at bedtime.      No current facility-administered medications on file prior to visit.   Allergies  Allergen Reactions  . Lactose Intolerance (Gi) Other (See Comments)    Stomach aches     CTAP with contrast  01/05/2021: 1. No abnormality of the small bowel or colon.  The cecum resides in in the mid right abdomen. 2. No acute findings in the abdomen  pelvis. 3. Stable small lesion centrally within the uterus. Recommend pelvic ultrasound if any history of uterine bleeding.  Current Medications, Allergies, Past Medical History, Past Surgical History, Family History and Social History were reviewed in [employer] record.   Review of Systems:   Constitutional: Negative for fever, sweats, chills or weight loss.  Respiratory: Negative for shortness of breath.   Cardiovascular: Negative for chest pain, palpitations and leg swelling.  Gastrointestinal: See HPI.  Musculoskeletal: Negative for back pain or muscle aches.  Neurological: Negative for dizziness, headaches or paresthesias.   Physical Exam: BP 106/64   Pulse 67   Ht 5\' 6"  (1.676 m)   Wt 179 lb (81.2 kg)   BMI 28.89 kg/m   General: 70 year old female in no acute distress. Head: Normocephalic and atraumatic. Eyes: No scleral icterus. Conjunctiva pink . Ears: Normal auditory acuity. Lungs: Clear throughout to auscultation. Heart: Regular rate and rhythm, no murmur. Abdomen: Soft, nontender and nondistended. No masses or hepatomegaly. Normal bowel sounds x 4 quadrants.  Rectal: Deferred.  Musculoskeletal: Symmetrical with no gross deformities. Extremities: No edema. Neurological: Alert oriented x 4. No focal deficits.  Psychological: Alert and cooperative. Normal mood and affect  Assessment and Recommendations:  48.  70 year old female with nonbloody diarrhea x3 months with intermittent lower abdominal pain, resolved. CTAP showed the cecum in the right mid abdomen otherwise showed a normal colon and small bowel. -Diagnostic colonoscopy recommended, patient declined at this time as her diarrhea and lower abdominal pain has abated -Patient to call our office if her lower abdominal pain or diarrhea recurs -Follow-up in the office in 3 months  2.  Colon cancer screen.  Patient reported undergoing a colonoscopy approximately 3 years ago without  significant findings. -Patient to obtain a copy of her colonoscopy procedure report for my review -See plan in # 1  3.  Infrequent reflux symptoms -Continue healthy diet and exercise -Pantoprazole 40 mg daily as needed -Call our office if symptoms worsen  4.  CTAP 01/05/2021 identified a small lesion in the uterus.  No postmenopausal vaginal bleeding. -I advised the patient to follow-up with her PCP regarding a small central uterine lesion identified per CTAP

## 2021-01-10 ENCOUNTER — Ambulatory Visit (INDEPENDENT_AMBULATORY_CARE_PROVIDER_SITE_OTHER): Payer: Medicare Other | Admitting: Nurse Practitioner

## 2021-01-10 ENCOUNTER — Encounter: Payer: Self-pay | Admitting: Nurse Practitioner

## 2021-01-10 VITALS — BP 106/64 | HR 67 | Ht 66.0 in | Wt 179.0 lb

## 2021-01-10 DIAGNOSIS — K219 Gastro-esophageal reflux disease without esophagitis: Secondary | ICD-10-CM

## 2021-01-10 DIAGNOSIS — R197 Diarrhea, unspecified: Secondary | ICD-10-CM | POA: Diagnosis not present

## 2021-01-10 MED ORDER — PANTOPRAZOLE SODIUM 40 MG PO TBEC: 40.0000 mg | DELAYED_RELEASE_TABLET | Freq: Every day | ORAL | 2 refills | Status: DC

## 2021-01-10 NOTE — Progress Notes (Signed)
____________________________________________________________  Attending physician addendum:  Thank you for sending this case to me. I have reviewed the entire note and agree with the plan.   Jessalyn Hinojosa Danis, MD  ____________________________________________________________  

## 2021-01-10 NOTE — Patient Instructions (Addendum)
If you are age 70 or older, your body mass index should be between 23-30. Your Body mass index is 28.89 kg/m. If this is out of the aforementioned range listed, please consider follow up with your Primary Care Provider.  Please obtain a copy of your last colonoscopy report.  Please follow up with Dr. Myrtie Neither in 3 months.  MEDICATION We have sent the following medication to your pharmacy for you to pick up at your convenience: Pantoprazole 40 Mg once a day.     It was great seeing you today! Thank you for entrusting me with your care and choosing [facility].  Arnaldo Natal, CRNP

## 2021-01-12 ENCOUNTER — Telehealth: Payer: Self-pay | Admitting: Nurse Practitioner

## 2021-01-12 NOTE — Telephone Encounter (Signed)
Spoke to patient who was seen in office a few days ago. She reports eating spicy chili last night,and woke up with acid indigestion. Patient was advised to take her Pantoprazole 40 mg 30 minutes before a meal. She will try to eat healthier and exercise more. Patient was asked to call the office next week if her symptoms do not improve. All questions answered. Patient voiced understanding.

## 2021-01-12 NOTE — Telephone Encounter (Signed)
Pt is requesting a call back from a nurse to discuss her abdominal pain. Pt states she had spicy chili and woke up with stomach pain, pt wants to know if this could be ulcer related or acidic.

## 2021-01-15 ENCOUNTER — Ambulatory Visit: Payer: Self-pay | Admitting: Nurse Practitioner

## 2021-02-22 ENCOUNTER — Telehealth: Payer: Self-pay | Admitting: Nurse Practitioner

## 2021-02-22 NOTE — Telephone Encounter (Signed)
Spoke with the patient. She had been doing well, exercising and being more active in general since she was last seen 01/10/21. She is having soft daily bowel movements. 1 day of diarrhea recently, but does not feel this is an issue. She called today with complaints of "stomach ache." This has been a daily issue for a week. She has reflux from the moment she gets up in the morning. She is taking Pantoprazole daily ac breakfast. No new medications or diet changes. She gets temporary relief with TUMS which she takes multiple times each day. She is using the Hyoscyamine for stomach cramps, however that does not help with this.  She is very very hesitant about a colonoscopy if you were to suggest that. She asked I let you know.

## 2021-02-26 DIAGNOSIS — M545 Low back pain, unspecified: Secondary | ICD-10-CM | POA: Diagnosis not present

## 2021-02-26 DIAGNOSIS — M5441 Lumbago with sciatica, right side: Secondary | ICD-10-CM | POA: Diagnosis not present

## 2021-03-01 ENCOUNTER — Other Ambulatory Visit: Payer: Self-pay

## 2021-03-01 MED ORDER — FAMOTIDINE 20 MG PO TABS: 20.0000 mg | ORAL_TABLET | Freq: Every day | ORAL | 2 refills | Status: DC

## 2021-03-01 NOTE — Telephone Encounter (Signed)
Beth, thank you for the update. Stomach ache is a new complaint. It is reasonable for the patient to take Pantoprazole 40mg  daily. If no improvement she may need an EGD. She can add Pepcid 20mg  po at bed time if needed.   We discussed her reluctance to have a colonoscopy during her last office visit, howwever, she was going to obtain a copy of the colonoscopy she had done possibly a few years ago but I have not received.  Pls verify if she obtained her past colonoscopy  record so I can provide an appropriate colon cancer screening colonoscopy recall date.  If her diarrhea has completely resolved she does not need to purse a diagnostic colonoscopy.

## 2021-03-01 NOTE — Telephone Encounter (Signed)
Spoke with the patient. She understands and agrees with this plan of care. She agrees to come by the office and sign a release form for Dr Bosie Clos to obtain the colonoscopy report.

## 2021-03-05 ENCOUNTER — Telehealth: Payer: Self-pay | Admitting: Nurse Practitioner

## 2021-03-06 MED ORDER — PANTOPRAZOLE SODIUM 40 MG PO TBEC: 40.0000 mg | DELAYED_RELEASE_TABLET | Freq: Every day | ORAL | 2 refills | Status: DC

## 2021-03-06 NOTE — Telephone Encounter (Signed)
RX sent

## 2021-03-10 DIAGNOSIS — F32A Depression, unspecified: Secondary | ICD-10-CM | POA: Diagnosis not present

## 2021-03-10 DIAGNOSIS — F419 Anxiety disorder, unspecified: Secondary | ICD-10-CM | POA: Diagnosis not present

## 2021-03-10 DIAGNOSIS — E785 Hyperlipidemia, unspecified: Secondary | ICD-10-CM | POA: Diagnosis not present

## 2021-03-10 DIAGNOSIS — M545 Low back pain, unspecified: Secondary | ICD-10-CM | POA: Diagnosis not present

## 2021-03-10 DIAGNOSIS — S39012A Strain of muscle, fascia and tendon of lower back, initial encounter: Secondary | ICD-10-CM | POA: Diagnosis not present

## 2021-03-11 DIAGNOSIS — M545 Low back pain, unspecified: Secondary | ICD-10-CM | POA: Diagnosis not present

## 2021-03-27 DIAGNOSIS — M545 Low back pain, unspecified: Secondary | ICD-10-CM | POA: Diagnosis not present

## 2021-04-17 DIAGNOSIS — U071 COVID-19: Secondary | ICD-10-CM | POA: Diagnosis not present

## 2021-04-24 DIAGNOSIS — J069 Acute upper respiratory infection, unspecified: Secondary | ICD-10-CM | POA: Diagnosis not present

## 2021-04-26 ENCOUNTER — Telehealth: Payer: Self-pay | Admitting: Nurse Practitioner

## 2021-04-26 NOTE — Telephone Encounter (Signed)
Inbound call from patient. States she is having stomach cramps and pain and requesting a refill for hyoscyamine to [facility] in [facility]

## 2021-04-26 NOTE — Telephone Encounter (Signed)
Called number provided with a busy signal. Will try again later.

## 2021-04-27 NOTE — Telephone Encounter (Signed)
Colleen, ok to fill?

## 2021-04-30 MED ORDER — HYOSCYAMINE SULFATE 0.125 MG PO TABS: ORAL_TABLET | ORAL | 1 refills | Status: DC

## 2021-04-30 NOTE — Telephone Encounter (Signed)
Refill sent.

## 2021-12-05 DIAGNOSIS — F411 Generalized anxiety disorder: Secondary | ICD-10-CM | POA: Diagnosis not present

## 2022-01-30 DIAGNOSIS — L821 Other seborrheic keratosis: Secondary | ICD-10-CM | POA: Diagnosis not present

## 2022-01-30 DIAGNOSIS — L578 Other skin changes due to chronic exposure to nonionizing radiation: Secondary | ICD-10-CM | POA: Diagnosis not present

## 2022-01-30 DIAGNOSIS — I781 Nevus, non-neoplastic: Secondary | ICD-10-CM | POA: Diagnosis not present

## 2022-03-15 DIAGNOSIS — J069 Acute upper respiratory infection, unspecified: Secondary | ICD-10-CM | POA: Diagnosis not present

## 2022-03-15 DIAGNOSIS — F411 Generalized anxiety disorder: Secondary | ICD-10-CM | POA: Diagnosis not present

## 2022-03-26 ENCOUNTER — Encounter (HOSPITAL_BASED_OUTPATIENT_CLINIC_OR_DEPARTMENT_OTHER): Payer: Self-pay

## 2022-03-26 ENCOUNTER — Other Ambulatory Visit (HOSPITAL_BASED_OUTPATIENT_CLINIC_OR_DEPARTMENT_OTHER): Payer: Self-pay | Admitting: Family Medicine

## 2022-03-26 ENCOUNTER — Ambulatory Visit (HOSPITAL_BASED_OUTPATIENT_CLINIC_OR_DEPARTMENT_OTHER)
Admission: RE | Admit: 2022-03-26 | Discharge: 2022-03-26 | Disposition: A | Discharge status: Home / Self Care | Payer: Medicare Other | Source: Ambulatory Visit | Attending: Family Medicine | Admitting: Family Medicine

## 2022-03-26 DIAGNOSIS — J3489 Other specified disorders of nose and nasal sinuses: Secondary | ICD-10-CM | POA: Diagnosis not present

## 2022-03-26 DIAGNOSIS — R519 Headache, unspecified: Secondary | ICD-10-CM | POA: Diagnosis not present

## 2022-04-11 DIAGNOSIS — R519 Headache, unspecified: Secondary | ICD-10-CM | POA: Diagnosis not present

## 2022-04-11 DIAGNOSIS — R0981 Nasal congestion: Secondary | ICD-10-CM | POA: Diagnosis not present

## 2022-04-11 DIAGNOSIS — J342 Deviated nasal septum: Secondary | ICD-10-CM | POA: Diagnosis not present

## 2022-05-23 DIAGNOSIS — J3489 Other specified disorders of nose and nasal sinuses: Secondary | ICD-10-CM | POA: Diagnosis not present

## 2022-05-23 DIAGNOSIS — F411 Generalized anxiety disorder: Secondary | ICD-10-CM | POA: Diagnosis not present

## 2022-05-23 DIAGNOSIS — G894 Chronic pain syndrome: Secondary | ICD-10-CM | POA: Diagnosis not present

## 2022-06-05 DIAGNOSIS — J3489 Other specified disorders of nose and nasal sinuses: Secondary | ICD-10-CM | POA: Diagnosis not present

## 2022-06-05 DIAGNOSIS — Z131 Encounter for screening for diabetes mellitus: Secondary | ICD-10-CM | POA: Diagnosis not present

## 2022-06-05 DIAGNOSIS — K582 Mixed irritable bowel syndrome: Secondary | ICD-10-CM | POA: Diagnosis not present

## 2022-06-05 DIAGNOSIS — R519 Headache, unspecified: Secondary | ICD-10-CM | POA: Diagnosis not present

## 2022-06-05 DIAGNOSIS — Z1239 Encounter for other screening for malignant neoplasm of breast: Secondary | ICD-10-CM | POA: Diagnosis not present

## 2022-06-05 DIAGNOSIS — E785 Hyperlipidemia, unspecified: Secondary | ICD-10-CM | POA: Diagnosis not present

## 2022-06-10 DIAGNOSIS — Z961 Presence of intraocular lens: Secondary | ICD-10-CM | POA: Diagnosis not present

## 2022-06-10 DIAGNOSIS — H02834 Dermatochalasis of left upper eyelid: Secondary | ICD-10-CM | POA: Diagnosis not present

## 2022-06-10 DIAGNOSIS — H43812 Vitreous degeneration, left eye: Secondary | ICD-10-CM | POA: Diagnosis not present

## 2022-06-10 DIAGNOSIS — H02831 Dermatochalasis of right upper eyelid: Secondary | ICD-10-CM | POA: Diagnosis not present

## 2022-06-27 ENCOUNTER — Encounter: Payer: Self-pay | Admitting: *Deleted

## 2022-07-02 ENCOUNTER — Ambulatory Visit: Payer: Medicare Other | Admitting: Psychiatry

## 2022-07-24 DIAGNOSIS — J310 Chronic rhinitis: Secondary | ICD-10-CM | POA: Diagnosis not present

## 2022-07-24 DIAGNOSIS — J343 Hypertrophy of nasal turbinates: Secondary | ICD-10-CM | POA: Diagnosis not present

## 2022-07-24 DIAGNOSIS — J342 Deviated nasal septum: Secondary | ICD-10-CM | POA: Diagnosis not present

## 2022-07-30 ENCOUNTER — Ambulatory Visit (INDEPENDENT_AMBULATORY_CARE_PROVIDER_SITE_OTHER): Payer: Medicare Other | Admitting: Psychiatry

## 2022-07-30 VITALS — BP 117/56 | HR 69 | Ht 66.0 in | Wt 176.5 lb

## 2022-07-30 DIAGNOSIS — G8929 Other chronic pain: Secondary | ICD-10-CM

## 2022-07-30 DIAGNOSIS — G509 Disorder of trigeminal nerve, unspecified: Secondary | ICD-10-CM

## 2022-07-30 DIAGNOSIS — R519 Headache, unspecified: Secondary | ICD-10-CM | POA: Diagnosis not present

## 2022-07-30 MED ORDER — GABAPENTIN 300 MG PO CAPS: ORAL_CAPSULE | ORAL | 4 refills | Status: DC

## 2022-07-30 MED ORDER — GABAPENTIN 300 MG PO CAPS: 300.0000 mg | ORAL_CAPSULE | Freq: Three times a day (TID) | ORAL | 4 refills | Status: DC

## 2022-07-30 NOTE — Progress Notes (Addendum)
Referring:  Earvin Hansen, MD [place],  [place] [address]  PCP: [facility], [place]  Neurology was asked to evaluate Teresa Hopkins, a 71 year old female for a chief complaint of headaches.  Our recommendations of care will be communicated by shared medical record.    CC:  facial pain  History provided from self  HPI:  Medical co-morbidities: HLD, osteoarthritis, GAD  The patient presents for evaluation of facial pain which began 3 months ago. About 3 months ago she had a tooth implant in her right upper gum. Otherwise nothing out of the ordinary occurred around that time. No recent illnesses or head trauma.   She describes the pain as constant burning pain in her nostrils. Predominantly on the right side but can be on the left as well. Sometimes pain will become so bad that she will develop a frontal headache. This is associated with phonophobia and osmophobia, no photophobia or nausea. No vision changes. She has tried saline flushes which irritated her sinuses. Takes hydrocodone as needed which relieves the pain for 3-4 hours, but then pain returns. Advil and ibuprofen do not help.  Saw ENT who ordered CT sinus 03/26/22 showed a right nasal septal deviation and left bony spur but no sign of active sinusitis.  Recent eye exam was normal.  FACIAL PAIN FEATURES  Side: right and left Distribution: nostrils Any pain on side or back of head: yes Character: burning Duration: constant Pain-free between episodes: no Triggers: strong smells, touching face Sensory abnormalities: no Tried Tegretol/Trileptal: no Prior procedures/outcome: no History of MS, Lyme's disease, facial rash: no History of dental/oral surgery, facial/plastic surgery: dental implant on right  Current Treatment: Abortive hydrocodone  Preventative none  Prior Therapies                                 Fluoxetine 40 mg daily Tylenol Hydrocodone  LABS: CBC    Component Value Date/Time    WBC 7.3 12/18/2020 1426   RBC 3.84 (L) 12/18/2020 1426   HGB 13.3 12/18/2020 1426   HCT 38.8 12/18/2020 1426   PLT 254.0 12/18/2020 1426   MCV 101.2 (H) 12/18/2020 1426   MCH 33.9 01/14/2020 1355   MCHC 34.3 12/18/2020 1426   RDW 12.5 12/18/2020 1426   LYMPHSABS 2.8 12/18/2020 1426   MONOABS 0.6 12/18/2020 1426   EOSABS 0.0 12/18/2020 1426   BASOSABS 0.0 12/18/2020 1426      Latest Ref Rng & Units 12/18/2020    2:26 PM 01/14/2020    1:55 PM 06/21/2016    1:19 PM  CMP  Glucose 70 - 99 mg/dL 99  76  102   BUN 6 - 23 mg/dL 12  19  19    Creatinine 0.40 - 1.20 mg/dL 0.78  0.77  0.85   Sodium 135 - 145 mEq/L 140  143  140   Potassium 3.5 - 5.1 mEq/L 3.8  4.0  3.6   Chloride 96 - 112 mEq/L 104  106  108   CO2 19 - 32 mEq/L 25  26  24    Calcium 8.4 - 10.5 mg/dL 9.7  9.3  9.4   Total Protein 6.0 - 8.3 g/dL 7.7  7.4  7.0   Total Bilirubin 0.2 - 1.2 mg/dL 0.5  0.8  0.5   Alkaline Phos 39 - 117 U/L 60  55  56   AST 0 - 37 U/L 18  20  24   ALT 0 - 35 U/L 16  18  22       IMAGING:  CT sinus 03/26/22: Minimal mucosal thickening within the right frontoethmoidal recess and right maxillary sinus.   The paranasal sinuses are otherwise normally aerated. Patent sinus drainage pathways.   Rightward deviation of the nasal septum.   Minimal leftward bony spurring of the nasal septum posteriorly.  Imaging independently reviewed on July 30, 2022   Current Outpatient Medications on File Prior to Visit  Medication Sig Dispense Refill   FLUoxetine (PROZAC) 40 MG capsule Take 40 mg by mouth daily.      HYDROcodone-acetaminophen (NORCO/VICODIN) 5-325 MG tablet Take 1 tablet by mouth 2 (two) times daily as needed.     LORazepam (ATIVAN) 1 MG tablet Take 1 mg by mouth 4 (four) times daily as needed (anxiety).      tretinoin (RETIN-A) 0.1 % cream Apply 1 application topically at bedtime.      famotidine (PEPCID) 20 MG tablet Take 1 tablet (20 mg total) by mouth at bedtime. (Patient not taking:  Reported on 07/30/2022) 30 tablet 2   hyoscyamine (LEVSIN) 0.125 MG tablet 1-2 TABLET AS NEEDED FOR ABDMINAL CRAMPS ORALLY EVERY 6 HRS AS NEEDED FOR ABDOMINAL CRAMPING (Patient not taking: Reported on 07/30/2022) 30 tablet 1   pantoprazole (PROTONIX) 40 MG tablet Take 1 tablet (40 mg total) by mouth daily. Take 30 minutes before breakfast. (Patient not taking: Reported on 07/30/2022) 30 tablet 2   Saccharomyces boulardii (PROBIOTIC) 250 MG CAPS Take 1 capsule by mouth daily. (Patient not taking: Reported on 07/30/2022)     simvastatin (ZOCOR) 20 MG tablet Take 20 mg by mouth daily at 6 PM.  (Patient not taking: Reported on 07/30/2022)     No current facility-administered medications on file prior to visit.     Allergies: Allergies  Allergen Reactions   Lactose Intolerance (Gi) Other (See Comments)    Stomach aches    Family History: Family History  Problem Relation Age of Onset   Alzheimer's disease Mother    Heart attack Father      Past Medical History: Past Medical History:  Diagnosis Date   Acid reflux    Anxiety    Chronic back pain    Chronic facial pain    Depression    Gastritis    Hyperlipidemia    Hyperlipidemia     Past Surgical History Past Surgical History:  Procedure Laterality Date   ABDOMINAL HYSTERECTOMY     BACK SURGERY     microdiskectomy - 03/2015   BUNIONECTOMY Right    CATARACT EXTRACTION     fallopian tube and ovary surgery     LUMBAR LAMINECTOMY/DECOMPRESSION MICRODISCECTOMY N/A 06/12/2015   Procedure: redo L45 microdiskectomy;  Surgeon: Ashok Pall, MD;  Location: [facility];  Service: Neurosurgery;  Laterality: N/A;  redo L45 microdiskectomy   TOTAL HIP ARTHROPLASTY Left 01/21/2020   Procedure: TOTAL HIP ARTHROPLASTY ANTERIOR APPROACH;  Surgeon: Dorna Leitz, MD;  Location: [facility];  Service: Orthopedics;  Laterality: Left;    Social History: Social History   Tobacco Use   Smoking status: Former    Types: Cigarettes    Quit date:  06/08/2000    Years since quitting: 22.1   Smokeless tobacco: Never  Vaping Use   Vaping Use: Never used  Substance Use Topics   Alcohol use: Yes    Comment: occasional   Drug use: No    ROS: Negative for fevers, chills. Positive  for facial pain. All other systems reviewed and negative unless stated otherwise in HPI.   Physical Exam:   Vital Signs: BP (!) 117/56   Pulse 69   Ht 5\' 6"  (1.676 m)   Wt 176 lb 8 oz (80.1 kg)   BMI 28.49 kg/m  GENERAL: well appearing,in no acute distress,alert SKIN:  Color, texture, turgor normal. No rashes or lesions HEAD:  Normocephalic/atraumatic. CV:  RRR RESP: Normal respiratory effort MSK: no tenderness to palpation over occiput, neck, or shoulders  NEUROLOGICAL: Mental Status: Alert, oriented to person, place and time,Follows commands Cranial Nerves: PERRL, visual fields intact to confrontation, extraocular movements intact, facial sensation intact, no facial droop or ptosis, hearing grossly intact, no dysarthria Motor: muscle strength 5/5 both upper and lower extremities Reflexes: 2+ throughout Sensation: intact to light touch all 4 extremities Coordination: Finger-to- nose-finger intact bilaterally Gait: normal-based   IMPRESSION: 71 year old female with a history of HLD, osteoarthritis, GAD who presents for evaluation of facial pain. 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 Will order MRI/MRA of the brain to rule out trigeminal nerve compression. She does also endorse headaches with osmophobia and phonophobia, could consider treatment for migraine if no improvement with nerve medications.  PLAN: -MRI/MRA of the brain -Start gabapentin 300 mg QHS x1 week, then increase to 300 mg BID x1 week, then increase to 300 mg TID -Next steps: consider Lyrica, topamax   I spent a total of 43 minutes chart reviewing and counseling the patient. Discussed treatment options  including preventive medications. Discussed medication side effects, adverse reactions and drug interactions. Written educational materials and patient instructions outlining all of the above were given.  Follow-up: 3 months   Genia Harold, MD 07/30/2022   12:20 PM

## 2022-07-30 NOTE — Patient Instructions (Addendum)
MRI of brain and blood vessels in the head  Start gabapentin 300 mg at bedtime for facial pain for one week. Then can increase to 300 mg twice a day for one week. Then can increase to 300 mg three times a day.  Otherwise known as Atypical Facial Pain, Persistent idiopathic facial pain is a type of chronic facial pain which does not have typical features of trigeminal neuralgia. It is described as a moderate to severe ache or burning sensation that usually affects areas of the face that do not conform to the neural distribution of the trigeminal nerve. Imaging and neurological exam are typically normal. Treatment options include antidepressants or anticonvulsants such as gabapentin, Lyrica, or Topamax

## 2022-07-31 ENCOUNTER — Telehealth: Payer: Self-pay | Admitting: Psychiatry

## 2022-07-31 NOTE — Telephone Encounter (Signed)
medicare/Cigna sent to [facility] they obtain auth

## 2022-08-08 ENCOUNTER — Telehealth: Payer: Self-pay | Admitting: Psychiatry

## 2022-08-08 NOTE — Telephone Encounter (Signed)
Contacted pt home # back X2, line just rung. No answer.  Contact pt cell, phone went straight to VM  Will try again later

## 2022-08-08 NOTE — Telephone Encounter (Signed)
Pt has called to report that the gabapentin (NEURONTIN) 300 MG capsule, does not  always help her facial pain.  Pt states she often times take hydrocodone.  Pt then said she sometimes takes the Gabapentin, 2 tylenol and hours later a Hydrocodone for the pain.  Pt is asking for a call to discuss.

## 2022-08-08 NOTE — Telephone Encounter (Signed)
[facility] has been calling her to schedule MRI. I sent them a message to call her again.

## 2022-08-08 NOTE — Telephone Encounter (Signed)
Pt is asking for a call from coordinator to schedule her MRI

## 2022-08-12 NOTE — Telephone Encounter (Signed)
Contacted pt again, line continued to ring. No answer

## 2022-08-13 ENCOUNTER — Telehealth: Payer: Self-pay | Admitting: Psychiatry

## 2022-08-13 NOTE — Telephone Encounter (Signed)
Pt is calling. Stated medication gabapentin (NEURONTIN) 300 MG capsule is not working on most day. Pt is requesting a  call back from the nurse.

## 2022-08-14 ENCOUNTER — Encounter: Payer: Self-pay | Admitting: *Deleted

## 2022-08-14 NOTE — Telephone Encounter (Signed)
If gabapentin is not causing side effects, she can increase her nighttime dose to 600 mg (so she would be taking 300 mg in AM, 300 mg in afternoon, and 600 mg at bedtime)

## 2022-08-14 NOTE — Telephone Encounter (Signed)
Called patient who stated she's taking gabapentin 300 mg 3 x daily. Most days the 3 tablets do not relive her pain. She occasionally will also take ASA with it. I advised will let Dr Billey Gosling know and call her later today with MD's advice. Patient verbalized understanding, appreciation.

## 2022-08-14 NOTE — Telephone Encounter (Addendum)
Spoke with patient and reviewed Dr Georgina Peer gabapentin new dosing. She repeated correctly.  I advised she try new dose x 1 week, let us know if effective so a new Rx can be sent in. She then asked about her MRI orders. I advised her that per her appt desk she has been left multiple messages, however I am unable to tell who is calling her. I advised I;ll find out and send her information via my chart. Patient verbalized understanding, appreciation.

## 2022-08-21 ENCOUNTER — Other Ambulatory Visit: Payer: Self-pay | Admitting: Psychiatry

## 2022-08-21 ENCOUNTER — Telehealth: Payer: Self-pay | Admitting: Psychiatry

## 2022-08-21 MED ORDER — GABAPENTIN 300 MG PO CAPS: ORAL_CAPSULE | ORAL | 4 refills | Status: DC

## 2022-08-21 NOTE — Telephone Encounter (Signed)
She can increase to 600 mg in the morning, 300 mg in the afternoon, and 600 mg at bedtime. If still no improvement after a week she can increase to 600 mg three times a day

## 2022-08-21 NOTE — Telephone Encounter (Signed)
Pt called stating that the gabapentin (NEURONTIN) 300 MG capsule is not working for her at all. She is still having lots of pain. Please advise.

## 2022-08-21 NOTE — Telephone Encounter (Signed)
Called patient, advised of Dr Billey Gosling recommendation to increase gabapentin. Advised a new Rx was sent, call for any questions, concerns. Patient verbalized understanding, appreciation.

## 2022-08-22 NOTE — Telephone Encounter (Signed)
Called patient, Dr Billey Gosling she can increase dose to 900 mg tid and see if that helps. I won't be able to get her scans sooner, she'd have to go to ED for quicker scans or if she's in debilitating pain. She stated she won't take any more gabapentin because it isn't helping. She will call [facility] tomorrow to see if scans ca be moved sooner. I advised she take hydrocodone as needed to help control pain. Patient verbalized understanding, appreciation.

## 2022-08-22 NOTE — Telephone Encounter (Signed)
Called patient and advised she's not given increased dose of gabapentin time to see if it is effective. Advised she call PCP. She stated she's had this pain x 4 months, has seen 2 ENT, had scans, labs and nothing was found. Hydrocodone is only thing that helps; she only takes if absolutely necessary. I advised she may need to take  more often to get pain under control. Advised she call [facility] to see if scans can be moved up sooner. Advised will message MD and let her know. Patient verbalized understanding, appreciation.

## 2022-08-22 NOTE — Telephone Encounter (Signed)
Pt reports that the Gabapentin has not given her any relief.  Pt states she is having sever nasal pain.  Pt would like to know if anything can be done to get her an earlier MRI or if something can be given to her better than the Gabapentin, please call pt on home#

## 2022-08-24 ENCOUNTER — Encounter (INDEPENDENT_AMBULATORY_CARE_PROVIDER_SITE_OTHER): Payer: Medicare Other | Admitting: Psychiatry

## 2022-08-24 DIAGNOSIS — R519 Headache, unspecified: Secondary | ICD-10-CM

## 2022-08-27 ENCOUNTER — Telehealth: Payer: Self-pay | Admitting: Psychiatry

## 2022-08-27 ENCOUNTER — Other Ambulatory Visit: Payer: Self-pay | Admitting: Psychiatry

## 2022-08-27 ENCOUNTER — Ambulatory Visit: Payer: Medicare Other | Admitting: Psychiatry

## 2022-08-27 DIAGNOSIS — J3489 Other specified disorders of nose and nasal sinuses: Secondary | ICD-10-CM

## 2022-08-27 DIAGNOSIS — R519 Headache, unspecified: Secondary | ICD-10-CM

## 2022-08-27 MED ORDER — PREGABALIN 75 MG PO CAPS: 75.0000 mg | ORAL_CAPSULE | Freq: Two times a day (BID) | ORAL | 5 refills | Status: DC

## 2022-08-27 NOTE — Telephone Encounter (Signed)

## 2022-08-27 NOTE — Telephone Encounter (Signed)
Referral for pain clinic fax to [facility]. Phone: [phone], fax: [phone]

## 2022-08-28 ENCOUNTER — Telehealth: Payer: Self-pay | Admitting: Psychiatry

## 2022-08-28 ENCOUNTER — Ambulatory Visit
Admission: RE | Admit: 2022-08-28 | Discharge: 2022-08-28 | Disposition: A | Discharge status: Home / Self Care | Payer: Medicare Other | Source: Ambulatory Visit | Attending: Psychiatry | Admitting: Psychiatry

## 2022-08-28 DIAGNOSIS — R519 Headache, unspecified: Secondary | ICD-10-CM

## 2022-08-28 DIAGNOSIS — G509 Disorder of trigeminal nerve, unspecified: Secondary | ICD-10-CM

## 2022-08-28 MED ORDER — GADOPICLENOL 0.5 MMOL/ML IV SOLN
8.0000 mL | Freq: Once | INTRAVENOUS | Status: AC | PRN
  Administered 2022-08-28: 8 mL via INTRAVENOUS

## 2022-08-28 NOTE — Telephone Encounter (Signed)
Referral for pain clinic e-mail to [email or web address] for [facility]. Phone: [phone].

## 2022-08-28 NOTE — Telephone Encounter (Signed)
medicareNPR/Cigna sent to [facility] they obtain auth [id number]

## 2022-09-10 DIAGNOSIS — J309 Allergic rhinitis, unspecified: Secondary | ICD-10-CM | POA: Diagnosis not present

## 2022-09-10 DIAGNOSIS — G509 Disorder of trigeminal nerve, unspecified: Secondary | ICD-10-CM | POA: Diagnosis not present

## 2022-09-17 DIAGNOSIS — Z1231 Encounter for screening mammogram for malignant neoplasm of breast: Secondary | ICD-10-CM | POA: Diagnosis not present

## 2022-09-17 DIAGNOSIS — Z13820 Encounter for screening for osteoporosis: Secondary | ICD-10-CM | POA: Diagnosis not present

## 2022-09-17 DIAGNOSIS — R92323 Mammographic fibroglandular density, bilateral breasts: Secondary | ICD-10-CM | POA: Diagnosis not present

## 2022-09-23 MED ORDER — PREGABALIN 75 MG PO CAPS: ORAL_CAPSULE | ORAL | 5 refills | Status: DC

## 2022-09-23 NOTE — Addendum Note (Signed)
Addended by: Ocie Doyne on: 09/23/2022 03:42 PM   Modules accepted: Orders

## 2022-09-23 NOTE — Telephone Encounter (Signed)
Please see the MyChart message reply(ies) for my assessment and plan.    This patient gave consent for this Medical Advice Message and is aware that it may result in a bill to [employer], as well as the possibility of receiving a bill for a co-payment or deductible. They are an established patient, but are not seeking medical advice exclusively about a problem treated during an in person or video visit in the last seven days. I did not recommend an in person or video visit within seven days of my reply.    I spent a total of 5 minutes cumulative time within 7 days through [employer].  Ocie Doyne, MD  09/23/22 3:42 PM

## 2022-09-28 ENCOUNTER — Ambulatory Visit
Admission: RE | Admit: 2022-09-28 | Discharge: 2022-09-28 | Disposition: A | Discharge status: Home / Self Care | Payer: Medicare Other | Source: Ambulatory Visit | Attending: Psychiatry | Admitting: Psychiatry

## 2022-09-28 DIAGNOSIS — R519 Headache, unspecified: Secondary | ICD-10-CM

## 2022-09-28 DIAGNOSIS — J3489 Other specified disorders of nose and nasal sinuses: Secondary | ICD-10-CM

## 2022-09-28 MED ORDER — GADOPICLENOL 0.5 MMOL/ML IV SOLN
8.0000 mL | Freq: Once | INTRAVENOUS | Status: AC | PRN
  Administered 2022-09-28: 8 mL via INTRAVENOUS

## 2022-09-30 ENCOUNTER — Encounter (INDEPENDENT_AMBULATORY_CARE_PROVIDER_SITE_OTHER): Payer: Medicare Other | Admitting: Psychiatry

## 2022-09-30 DIAGNOSIS — R519 Headache, unspecified: Secondary | ICD-10-CM | POA: Diagnosis not present

## 2022-09-30 DIAGNOSIS — G8929 Other chronic pain: Secondary | ICD-10-CM | POA: Diagnosis not present

## 2022-09-30 MED ORDER — OXCARBAZEPINE 150 MG PO TABS: 150.0000 mg | ORAL_TABLET | Freq: Two times a day (BID) | ORAL | 6 refills | Status: DC

## 2022-09-30 NOTE — Telephone Encounter (Signed)
Please see the MyChart message reply(ies) for my assessment and plan.    This patient gave consent for this Medical Advice Message and is aware that it may result in a bill to [employer], as well as the possibility of receiving a bill for a co-payment or deductible. They are an established patient, but are not seeking medical advice exclusively about a problem treated during an in person or video visit in the last seven days. I did not recommend an in person or video visit within seven days of my reply.    I spent a total of 5 minutes cumulative time within 7 days through [employer].  Ocie Doyne, MD  09/30/22 3:49 PM

## 2022-10-07 ENCOUNTER — Telehealth: Payer: Self-pay | Admitting: Psychiatry

## 2022-10-07 NOTE — Telephone Encounter (Signed)
Pt is calling. Stated she needs her Office notes and facial MRI sent to Dr. Lelon Huh office at (fax): [phone].

## 2022-10-07 NOTE — Telephone Encounter (Signed)
Are you able to help with this, please ?

## 2022-10-08 NOTE — Telephone Encounter (Signed)
She would like the reason you prescribed oxcarbazepine and how it works differently

## 2022-10-09 NOTE — Telephone Encounter (Signed)
Please see the MyChart message reply(ies) for my assessment and plan.    This patient gave consent for this Medical Advice Message and is aware that it may result in a bill to [employer], as well as the possibility of receiving a bill for a co-payment or deductible. They are an established patient, but are not seeking medical advice exclusively about a problem treated during an in person or video visit in the last seven days. I did not recommend an in person or video visit within seven days of my reply.    I spent a total of 5 minutes cumulative time within 7 days through [employer].  Ocie Doyne, MD  10/09/22 3:50 PM

## 2022-10-14 ENCOUNTER — Telehealth: Payer: Self-pay | Admitting: Psychiatry

## 2022-10-14 NOTE — Telephone Encounter (Signed)
Pt states the 3rd medication she was given for her nasal pain caused her stomach pain for 5 mins.  Pt does not want to use any of the 3.  She is back to [employer] and tylenol.  Pt is asking for a call to discuss what her options are, the pain is very extreme

## 2022-10-14 NOTE — Telephone Encounter (Signed)
Please advise 

## 2022-10-14 NOTE — Telephone Encounter (Signed)
The pt also had sent a mychart message about this. We routed to Dr Delena Bali for her to inform what her recommendation would be

## 2022-10-15 ENCOUNTER — Telehealth: Payer: Self-pay | Admitting: Psychiatry

## 2022-10-15 NOTE — Telephone Encounter (Signed)
Please advise, pt has also called

## 2022-10-15 NOTE — Telephone Encounter (Signed)
At this point I think she would benefit from evaluation from an academic center as her workup has been negative and she has not been able to tolerate several medications. I've placed a referral to [facility] neurology for her

## 2022-10-15 NOTE — Telephone Encounter (Signed)
Referral for Neurology fax to [facility]. Phone: [phone], Fax: [phone] 

## 2022-10-15 NOTE — Addendum Note (Signed)
Addended by: Ocie Doyne on: 10/15/2022 12:41 PM   Modules accepted: Orders

## 2022-11-05 ENCOUNTER — Telehealth: Payer: Self-pay | Admitting: Psychiatry

## 2022-11-05 NOTE — Telephone Encounter (Signed)
Called Teresa Hopkins and she informed me that she actually stayed off all medications for 2 days and then started back on the Lyrica and has continued taking it for the past 3-4 weeks and she states that it is not perfect but has not doubled over in tears and pain and has been feeling better than she has before and is wanting to continue on the Lyrica. Please advise.

## 2022-11-05 NOTE — Telephone Encounter (Signed)
Pt requesting a refill for Pregablin 75mg  sent to [facility].   Could not find medication in pt's chart.

## 2022-11-06 ENCOUNTER — Other Ambulatory Visit: Payer: Self-pay | Admitting: Neurology

## 2022-11-06 MED ORDER — PREGABALIN 75 MG PO CAPS: 75.0000 mg | ORAL_CAPSULE | Freq: Two times a day (BID) | ORAL | 5 refills | Status: DC

## 2022-11-06 MED ORDER — PREGABALIN 150 MG PO CAPS: 150.0000 mg | ORAL_CAPSULE | Freq: Two times a day (BID) | ORAL | 5 refills | Status: DC

## 2022-11-06 NOTE — Addendum Note (Signed)
Addended by: Gerline Legacy C on: 11/06/2022 10:20 AM   Modules accepted: Orders

## 2022-11-06 NOTE — Addendum Note (Signed)
Addended by: Genia Harold on: 11/06/2022 11:11 AM   Modules accepted: Orders

## 2022-11-23 ENCOUNTER — Encounter: Payer: Self-pay | Admitting: Psychiatry

## 2022-11-29 DIAGNOSIS — K5900 Constipation, unspecified: Secondary | ICD-10-CM | POA: Diagnosis not present

## 2022-12-03 ENCOUNTER — Ambulatory Visit: Payer: Medicare Other | Admitting: Family Medicine

## 2022-12-06 ENCOUNTER — Ambulatory Visit: Payer: Medicare Other | Admitting: Nurse Practitioner

## 2022-12-11 DIAGNOSIS — R109 Unspecified abdominal pain: Secondary | ICD-10-CM | POA: Diagnosis not present

## 2022-12-11 DIAGNOSIS — R197 Diarrhea, unspecified: Secondary | ICD-10-CM | POA: Diagnosis not present

## 2022-12-11 DIAGNOSIS — F411 Generalized anxiety disorder: Secondary | ICD-10-CM | POA: Diagnosis not present

## 2022-12-11 DIAGNOSIS — F419 Anxiety disorder, unspecified: Secondary | ICD-10-CM | POA: Diagnosis not present

## 2022-12-11 DIAGNOSIS — E785 Hyperlipidemia, unspecified: Secondary | ICD-10-CM | POA: Diagnosis not present

## 2022-12-14 DIAGNOSIS — J019 Acute sinusitis, unspecified: Secondary | ICD-10-CM | POA: Diagnosis not present

## 2022-12-14 DIAGNOSIS — U071 COVID-19: Secondary | ICD-10-CM | POA: Diagnosis not present

## 2022-12-16 ENCOUNTER — Other Ambulatory Visit (HOSPITAL_BASED_OUTPATIENT_CLINIC_OR_DEPARTMENT_OTHER): Payer: Self-pay | Admitting: Family Medicine

## 2022-12-16 DIAGNOSIS — R109 Unspecified abdominal pain: Secondary | ICD-10-CM

## 2022-12-19 ENCOUNTER — Ambulatory Visit: Payer: Medicare Other | Admitting: Gastroenterology

## 2022-12-28 ENCOUNTER — Ambulatory Visit (HOSPITAL_BASED_OUTPATIENT_CLINIC_OR_DEPARTMENT_OTHER)
Admission: RE | Admit: 2022-12-28 | Discharge: 2022-12-28 | Disposition: A | Discharge status: Home / Self Care | Payer: Medicare Other | Source: Ambulatory Visit | Attending: Family Medicine | Admitting: Family Medicine

## 2022-12-28 DIAGNOSIS — R109 Unspecified abdominal pain: Secondary | ICD-10-CM

## 2022-12-28 DIAGNOSIS — N281 Cyst of kidney, acquired: Secondary | ICD-10-CM | POA: Diagnosis not present

## 2022-12-28 DIAGNOSIS — I700 Atherosclerosis of aorta: Secondary | ICD-10-CM | POA: Diagnosis not present

## 2022-12-28 MED ORDER — IOHEXOL 300 MG/ML  SOLN
100.0000 mL | Freq: Once | INTRAMUSCULAR | Status: AC | PRN
  Administered 2022-12-28: 100 mL via INTRAVENOUS

## 2022-12-31 ENCOUNTER — Telehealth: Payer: Self-pay | Admitting: Psychiatry

## 2022-12-31 MED ORDER — GABAPENTIN 300 MG PO CAPS: 300.0000 mg | ORAL_CAPSULE | Freq: Three times a day (TID) | ORAL | 6 refills | Status: DC

## 2022-12-31 NOTE — Telephone Encounter (Signed)
Called pt back. Pt states sx started over a month ago. She feels sx r/t Lryica. She saw PCP who ordered CT scan stomach/pelvic area. Shows no blockage. Results unremarkable per pt. She gets very bloated, has a lot of pressure in stomach, in a lot of pain. Afraid to eat anything. She has appt with GI 01/29/23. Had to cx appt she had on 12/27/22. Aware I will send to Dr. Billey Gosling for review and call back.

## 2022-12-31 NOTE — Telephone Encounter (Signed)
Rx sent to her pharmacy, thanks

## 2022-12-31 NOTE — Telephone Encounter (Signed)
That's fine with me. Can we confirm what her dose of gabapentin was before she stopped it? I can see I recommended up to 900 mg TID but I'm not sure she ever actually took that dose.

## 2022-12-31 NOTE — Telephone Encounter (Signed)
Spoke with patient she was taking gabapentin 300 mg tablet tid. She asked Rx to be sent to [facility] in [place].

## 2022-12-31 NOTE — Telephone Encounter (Signed)
Pt is calling stated she needs to talk to nurse about medication pregabalin (LYRICA) 75 MG capsule. Stated she thinks this medication is making her have constipation and stomach pain.

## 2022-12-31 NOTE — Telephone Encounter (Signed)
She can decrease the Lyrica if she thinks this is causing side effects. She can stop the morning dose and just take Lyrica 75 mg at bedtime.

## 2022-12-31 NOTE — Telephone Encounter (Signed)
Called pt. She was taking one per day of Lryica but just went up to two per day a couple days ago d/t increased pain. Due to constipation issues, she would like to stop Lyrica and retry gabapentin if Dr. Billey Gosling is willing to call this back in for her.    Pharmacy:  [place], [place] - [address] Phone: [phone]  Fax: [phone]

## 2023-01-01 ENCOUNTER — Ambulatory Visit: Payer: Medicare Other | Admitting: Gastroenterology

## 2023-01-07 NOTE — Telephone Encounter (Signed)
I called and spoke with patient regarding the below. Patient is c/o constipation since stating taking Lyrica,she was taking Lyrica 75 mg tablet at bedtime, stopped Rx due to stomach pain, constipation, bloating. She saw her PCP saw PCP Ct abdomen and pelvis was order reports it was normal( in epic) she stopped Lyrica last Thursday and started gabapentin 300 mg TID for 4 days only and constipation eased up. Pt said she stopped gabapentin too, because the stomach pain continued. She is scheduled visit to see  [facility] on 01/28/13 to address stomach pain/constipation. Pt said her facial pain is back and causing headaches she was taking Advil bid to help with facial pain, but no relief.  She would like recommendations for something to help with facial pain ( she does not want to take lyrica again). Pt was tearful during call. Please advise

## 2023-01-07 NOTE — Telephone Encounter (Signed)
Pt called stated she needs to talk to nurse about getting some medication that's not going to hurt her stomach. States she is having facial pain. She is requesting a callback from nurse.

## 2023-01-07 NOTE — Telephone Encounter (Signed)
I had referred her to [facility] neurology for a second opinion back in December because I didn't have any other options to offer her. Can we check on the status of that referral?

## 2023-01-09 NOTE — Telephone Encounter (Signed)
Pt has called back for an update, pt informed that RNA reached out to the office out going referral coordinator. Please reach out to pt with update.

## 2023-01-09 NOTE — Telephone Encounter (Signed)
Teresa Hopkins- can you please check on this and then let pt know an update? Thank you!

## 2023-01-11 ENCOUNTER — Ambulatory Visit (HOSPITAL_BASED_OUTPATIENT_CLINIC_OR_DEPARTMENT_OTHER): Payer: Medicare Other

## 2023-01-14 ENCOUNTER — Telehealth: Payer: Self-pay | Admitting: Nurse Practitioner

## 2023-01-14 NOTE — Telephone Encounter (Signed)
Pt asking nurse to call back to discuss restarting Gabapentin. Pt refuse to take the phone number for [facility]. Pt said right now I'm only concerned about my facial pain right now.

## 2023-01-14 NOTE — Addendum Note (Signed)
Addended by: Wyvonnia Lora on: 01/14/2023 04:21 PM   Modules accepted: Orders

## 2023-01-14 NOTE — Telephone Encounter (Signed)
Spoke with Ebony Hail at [facility].  She said attempted 3 times to contact pt and sent a letter. Pt can give [place] a call to schedule her appt.

## 2023-01-14 NOTE — Telephone Encounter (Signed)
Inbound call from patient complaining of abdominal pain. Patient is scheduled for upcoming ov with a pa but is requesting to speak with a nurse in regards to symptoms and medication management. Please advise.  Thank you

## 2023-01-14 NOTE — Telephone Encounter (Signed)
Pt stated that she has been having constipation for about a month now. Pt stated she took MiraLAX several week ago and stated that it gave her diarrhea and she stopped it. Pt educated on " overflow diarrhea" Pt stated that she has been taking tylenol to assist with the pain 3 times daily (1000 mg.) Pt stated that she had slight nausea with no vomiting  Pt had an office visit with Alonza Bogus on 01/29/2023 at 11:00 which was rescheduled to 01/23/2023 at 1:30 PM. Pt made aware.  Pt given instructions for a miralax purge with read back.  Pt was notified that I will call her tomorrow for a symptom update.  Pt verbalized understanding with all questions answered.

## 2023-01-14 NOTE — Telephone Encounter (Signed)
Spoke w/ Deneise Lever, she will let pt know phone# to call for [facility] to call and schedule.

## 2023-01-14 NOTE — Telephone Encounter (Signed)
Chart reviewed, since her initial evaluation with Dr. Billey Gosling in September 2023:  She describes the pain as constant burning pain in her nostrils. Predominantly on the right side but can be on the left as well. Sometimes pain will become so bad that she will develop a frontal headache. This is associated with phonophobia and osmophobia, no photophobia or nausea. No vision changes.   Multiple phone, conversations, MyChart messaging, for medication use, failure to titrating dose of gabapentin, could not tolerate Lyrica due to side effect, initiated referral to [facility] neurologist in December 2023,  Please call patient back if she decided to retry gabapentin, make sure she has stopped taking Lyrica, do not overlap to avoid medication side effect, may restart gabapentin 300 mg 3 times a day, maximum 2 tablets 3 times a day  Also check to see if she is still on Trileptal 150 mg twice a day that was started on September 30, 2022

## 2023-01-14 NOTE — Telephone Encounter (Signed)
Pt said, for 2 weeks stop taking Lyrica because had constipation for several weeks and still have constipation,. Would like to start on Gabapentin, want to know if should take 1 tablet per  day or 2 tablet per day? Would like a call from the nurse

## 2023-01-14 NOTE — Telephone Encounter (Signed)
Dr. Krista Blue, any recommendations on pt restarting on gabapentin? Dr. Billey Gosling out, you are work in this am

## 2023-01-14 NOTE — Telephone Encounter (Signed)
Called pt. Cofnirmed she stopped Lyrica 2 wk ago. She also stopped oxcarbazepine. I updated med list. She will start back on gabapentin '300mg'$  taking 1 po twice daily for a week. If doing ok, will increase to TID. She was not comfortable going right to TID dosing.   Confirmed she has appt with GI this month still. Scheduled for 01/23/23.   Recommended she make appt with [facility]. Pt hesitant about this. Wants to see how going back on gabapentin works and what GI MD says first.

## 2023-01-14 NOTE — Telephone Encounter (Signed)
Pt stated that she took the miralax purge and had two small BM's. Still having the abdominal pain. Pt was notified to continue the MiraLAX twice daily until a large BM and then once a day, drink plenty of fluids and exercise as tolerated. Chart reviewed. Pt had recent CT scan. Recent office visits here have been canceled by pt. Pt notified if the pain is severe then she needs to go to the ED or Urgent care for evaluation:  Pt verbalized understanding with all questions answered.

## 2023-01-14 NOTE — Telephone Encounter (Signed)
Patient called requesting to speak with you regarding miralax ,she is having a severe stomach pain.

## 2023-01-17 NOTE — Telephone Encounter (Signed)
Inbound call from patient, states she is still experiencing constipation. States she is unsure if she should go to the ED or Urgent Care. States she would like to speak with a nurse because of new symptoms.

## 2023-01-17 NOTE — Telephone Encounter (Signed)
Pt stated that she is still having constipation along with severe abdominal pain: No BM since MiraLAX purge. Chart reviewed. Pt  recommended to go to the Urgent care or ED for evaluation and treatment.  Pt verbalized understanding with all questions answered.

## 2023-01-19 DIAGNOSIS — F419 Anxiety disorder, unspecified: Secondary | ICD-10-CM | POA: Diagnosis not present

## 2023-01-19 DIAGNOSIS — N390 Urinary tract infection, site not specified: Secondary | ICD-10-CM | POA: Diagnosis not present

## 2023-01-19 DIAGNOSIS — R109 Unspecified abdominal pain: Secondary | ICD-10-CM | POA: Diagnosis not present

## 2023-01-19 DIAGNOSIS — Z90710 Acquired absence of both cervix and uterus: Secondary | ICD-10-CM | POA: Diagnosis not present

## 2023-01-19 DIAGNOSIS — R197 Diarrhea, unspecified: Secondary | ICD-10-CM | POA: Diagnosis not present

## 2023-01-19 DIAGNOSIS — Z79899 Other long term (current) drug therapy: Secondary | ICD-10-CM | POA: Diagnosis not present

## 2023-01-19 DIAGNOSIS — E785 Hyperlipidemia, unspecified: Secondary | ICD-10-CM | POA: Diagnosis not present

## 2023-01-23 ENCOUNTER — Ambulatory Visit: Payer: Medicare Other | Admitting: Gastroenterology

## 2023-01-29 ENCOUNTER — Ambulatory Visit: Payer: Medicare Other | Admitting: Gastroenterology

## 2023-01-29 ENCOUNTER — Ambulatory Visit (INDEPENDENT_AMBULATORY_CARE_PROVIDER_SITE_OTHER): Payer: Medicare Other | Admitting: Family Medicine

## 2023-01-29 ENCOUNTER — Encounter: Payer: Self-pay | Admitting: Family Medicine

## 2023-01-29 VITALS — BP 112/56 | HR 79 | Ht 66.0 in | Wt 177.6 lb

## 2023-01-29 DIAGNOSIS — R109 Unspecified abdominal pain: Secondary | ICD-10-CM | POA: Diagnosis not present

## 2023-01-29 DIAGNOSIS — R519 Headache, unspecified: Secondary | ICD-10-CM

## 2023-01-29 DIAGNOSIS — G8929 Other chronic pain: Secondary | ICD-10-CM | POA: Diagnosis not present

## 2023-01-29 NOTE — Patient Instructions (Signed)
Below is our plan:  We will continue gabapentin 300mg  up to three times daily.   Please make sure you are staying well hydrated. I recommend 50-60 ounces daily. Well balanced diet and regular exercise encouraged. Consistent sleep schedule with 6-8 hours recommended.   Please continue follow up with care team as directed.   Follow up with me in 6 months   You may receive a survey regarding today's visit. I encourage you to leave honest feed back as I do use this information to improve patient care. Thank you for seeing me today!

## 2023-01-29 NOTE — Progress Notes (Signed)
Chief Complaint  Patient presents with   Follow-up    RM 1, alone. Last seen 07/30/22. Went to ER a week ago Sunday for diarrhea/cramps. Prescribed dicyclomine. This helped resolve sx by the next day. Had televisit with PCP today and asked him to renew dicyclomine since it helped. PCP said he would renew for a month. Wants her to see GI. She cx appt she had d/t stomach pain resolving. She will call back to r/s GI appt.     HISTORY OF PRESENT ILLNESS:  01/29/23 ALL:  Teresa Hopkins is a 72 y.o. female here today for follow up for atypical facial pain. She was seen in consult with Dr Billey Gosling 07/2022 and started on gabapentin 300mg  up to TID. MRI/MRA unremarkable. She was referred to pain management 08/2022 after failing gabapentin (ineffective), pregabalin (ineffective, stomach pain) and oxcarb (severe stomach pain 5 minutes after first dose). Referral to [facility] placed 10/2022 for second opinion as workup unremarkable and pain medications not helpful. She has not reached out to schedule appt with [facility] or pain management. She asked to restart gabapentin 12/2022. Now taking 300mg  twice daily. She does feel that pain is somewhat better. She continues to describe an intermittent burning of the entire nose. No radiating symptoms. No other areas of facial pain. She feels that she gets a headache at times after taking it. She denies severe pain. She reports stomach pain is much better after ER visit 01/19/2023 and being started on Bentyl 20mg  QID. She feels that anxiety is better now that stomach and facial pain is better. She had a dental implant placed of right upper jaw last year, shortly before facial pain started.    HISTORY (copied from Dr Georgina Peer previous note)  Medical co-morbidities: HLD, osteoarthritis, GAD   The patient presents for evaluation of facial pain which began 3 months ago. About 3 months ago she had a tooth implant in her right upper gum. Otherwise nothing out of the ordinary occurred around  that time. No recent illnesses or head trauma.    She describes the pain as constant burning pain in her nostrils. Predominantly on the right side but can be on the left as well. Sometimes pain will become so bad that she will develop a frontal headache. This is associated with phonophobia and osmophobia, no photophobia or nausea. No vision changes. She has tried saline flushes which irritated her sinuses. Takes hydrocodone as needed which relieves the pain for 3-4 hours, but then pain returns. Advil and ibuprofen do not help.   Saw ENT who ordered CT sinus 03/26/22 showed a right nasal septal deviation and left bony spur but no sign of active sinusitis.   Recent eye exam was normal.   FACIAL PAIN FEATURES  Side: right and left Distribution: nostrils Any pain on side or back of head: yes Character: burning Duration: constant Pain-free between episodes: no Triggers: strong smells, touching face Sensory abnormalities: no Tried Tegretol/Trileptal: no Prior procedures/outcome: no History of MS, Lyme's disease, facial rash: no History of dental/oral surgery, facial/plastic surgery: dental implant on right   Current Treatment: Abortive hydrocodone   Preventative none   Prior Therapies                                 Fluoxetine 40 mg daily Tylenol Hydrocodone    REVIEW OF SYSTEMS: Out of a complete 14 system review of symptoms, the patient complains only of the  following symptoms, facial pain, stomach pain, constipation, diarrhea and all other reviewed systems are negative.   ALLERGIES: Allergies  Allergen Reactions   Lactose Intolerance (Gi) Other (See Comments)    Stomach aches     HOME MEDICATIONS: Outpatient Medications Prior to Visit  Medication Sig Dispense Refill   dicyclomine (BENTYL) 20 MG tablet Take 20 mg by mouth 4 (four) times daily -  before meals and at bedtime.     FLUoxetine (PROZAC) 40 MG capsule Take 40 mg by mouth daily.      gabapentin (NEURONTIN) 300  MG capsule Take 1 capsule (300 mg total) by mouth 3 (three) times daily. (Patient taking differently: Take 300 mg by mouth 2 (two) times daily.) 90 capsule 6   LORazepam (ATIVAN) 1 MG tablet Take 1 mg by mouth 4 (four) times daily as needed (anxiety).      simvastatin (ZOCOR) 20 MG tablet Take 20 mg by mouth daily at 6 PM.     tretinoin (RETIN-A) 0.1 % cream Apply 1 application topically at bedtime.      famotidine (PEPCID) 20 MG tablet Take 1 tablet (20 mg total) by mouth at bedtime. (Patient not taking: Reported on 07/30/2022) 30 tablet 2   HYDROcodone-acetaminophen (NORCO/VICODIN) 5-325 MG tablet Take 1 tablet by mouth 2 (two) times daily as needed.     hyoscyamine (LEVSIN) 0.125 MG tablet 1-2 TABLET AS NEEDED FOR ABDMINAL CRAMPS ORALLY EVERY 6 HRS AS NEEDED FOR ABDOMINAL CRAMPING (Patient not taking: Reported on 07/30/2022) 30 tablet 1   pantoprazole (PROTONIX) 40 MG tablet Take 1 tablet (40 mg total) by mouth daily. Take 30 minutes before breakfast. (Patient not taking: Reported on 07/30/2022) 30 tablet 2   Saccharomyces boulardii (PROBIOTIC) 250 MG CAPS Take 1 capsule by mouth daily. (Patient not taking: Reported on 07/30/2022)     No facility-administered medications prior to visit.     PAST MEDICAL HISTORY: Past Medical History:  Diagnosis Date   Acid reflux    Anxiety    Chronic back pain    Chronic facial pain    Depression    Gastritis    Hyperlipidemia    Hyperlipidemia      PAST SURGICAL HISTORY: Past Surgical History:  Procedure Laterality Date   ABDOMINAL HYSTERECTOMY     BACK SURGERY     microdiskectomy - 03/2015   BUNIONECTOMY Right    CATARACT EXTRACTION     fallopian tube and ovary surgery     LUMBAR LAMINECTOMY/DECOMPRESSION MICRODISCECTOMY N/A 06/12/2015   Procedure: redo L45 microdiskectomy;  Surgeon: Ashok Pall, MD;  Location: [facility];  Service: Neurosurgery;  Laterality: N/A;  redo L45 microdiskectomy   TOTAL HIP ARTHROPLASTY Left 01/21/2020    Procedure: TOTAL HIP ARTHROPLASTY ANTERIOR APPROACH;  Surgeon: Dorna Leitz, MD;  Location: [facility];  Service: Orthopedics;  Laterality: Left;     FAMILY HISTORY: Family History  Problem Relation Age of Onset   Alzheimer's disease Mother    Heart attack Father      SOCIAL HISTORY: Social History   Socioeconomic History   Marital status: Married    Spouse name: Not on file   Number of children: Not on file   Years of education: Not on file   Highest education level: Not on file  Occupational History   Not on file  Tobacco Use   Smoking status: Former    Types: Cigarettes    Quit date: 06/08/2000    Years since quitting: 22.6   Smokeless tobacco: Never  Vaping Use   Vaping Use: Never used  Substance and Sexual Activity   Alcohol use: Yes    Comment: occasional   Drug use: No   Sexual activity: Not on file  Other Topics Concern   Not on file  Social History Narrative   Not on file   Social Determinants of Health   Financial Resource Strain: Not on file  Food Insecurity: Not on file  Transportation Needs: Not on file  Physical Activity: Not on file  Stress: Not on file  Social Connections: Not on file  Intimate Partner Violence: Not on file     PHYSICAL EXAM  Vitals:   01/29/23 1451  BP: (!) 112/56  Pulse: 79  Weight: 177 lb 9.6 oz (80.6 kg)  Height: 5\' 6"  (1.676 m)   Body mass index is 28.67 kg/m.  Generalized: Well developed, in no acute distress  Cardiology: normal rate and rhythm, no murmur auscultated  Respiratory: clear to auscultation bilaterally    Neurological examination  Mentation: Alert oriented to time, place, history taking. Follows all commands speech and language fluent Cranial nerve II-XII: Pupils were equal round reactive to light. Extraocular movements were full, visual field were full on confrontational test. Facial sensation and strength were normal. Head turning and shoulder shrug  were normal and symmetric. Motor: The motor  testing reveals 5 over 5 strength of all 4 extremities. Good symmetric motor tone is noted throughout.  Gait and station: Gait is normal.    DIAGNOSTIC DATA (LABS, IMAGING, TESTING) - I reviewed patient records, labs, notes, testing and imaging myself where available.  Lab Results  Component Value Date   WBC 7.3 12/18/2020   HGB 13.3 12/18/2020   HCT 38.8 12/18/2020   MCV 101.2 (H) 12/18/2020   PLT 254.0 12/18/2020      Component Value Date/Time   NA 140 12/18/2020 1426   K 3.8 12/18/2020 1426   CL 104 12/18/2020 1426   CO2 25 12/18/2020 1426   GLUCOSE 99 12/18/2020 1426   BUN 12 12/18/2020 1426   CREATININE 0.78 12/18/2020 1426   CALCIUM 9.7 12/18/2020 1426   PROT 7.7 12/18/2020 1426   ALBUMIN 4.4 12/18/2020 1426   AST 18 12/18/2020 1426   ALT 16 12/18/2020 1426   ALKPHOS 60 12/18/2020 1426   BILITOT 0.5 12/18/2020 1426   GFRNONAA >60 01/14/2020 1355   GFRAA >60 01/14/2020 1355   No results found for: "CHOL", "HDL", "LDLCALC", "LDLDIRECT", "TRIG", "CHOLHDL" No results found for: "HGBA1C" No results found for: "VITAMINB12" Lab Results  Component Value Date   TSH 2.14 12/18/2020        No data to display               No data to display           ASSESSMENT AND PLAN  72 y.o. year old female  has a past medical history of Acid reflux, Anxiety, Chronic back pain, Chronic facial pain, Depression, Gastritis, Hyperlipidemia, and Hyperlipidemia. here with    Chronic idiopathic facial pain  Orielle Cockey reports facial pain has improved on gabapentin. She may continue 300mg  up to three times daily. I have encouraged her to follow up with GI. We can consider pain management if needed down the road. Healthy lifestyle habits encouraged. She will follow up with PCP as directed. She will return to see me in 6 months, sooner if needed. She verbalizes understanding and agreement with this plan.   No orders of the defined types  were placed in this  encounter.    No orders of the defined types were placed in this encounter.    Debbora Presto, MSN, FNP-C 01/29/2023, 3:32 PM  [facility] [address], [place], [place] [address] [phone]

## 2023-03-06 ENCOUNTER — Telehealth: Payer: Self-pay | Admitting: Nurse Practitioner

## 2023-03-06 NOTE — Telephone Encounter (Signed)
Patient calling wanting to know if there's anything that can help with her abdominal pain to hold her off until her appointment 7/15. Please advise

## 2023-03-07 NOTE — Telephone Encounter (Signed)
Left message for patient to call back  

## 2023-03-10 DIAGNOSIS — R109 Unspecified abdominal pain: Secondary | ICD-10-CM | POA: Diagnosis not present

## 2023-03-10 DIAGNOSIS — F411 Generalized anxiety disorder: Secondary | ICD-10-CM | POA: Diagnosis not present

## 2023-03-10 NOTE — Telephone Encounter (Signed)
Spoke with pt. Pt stated that she has been dealing with the abdominal pain for about 2 months now, went to ER on 01/19/2023. Pt taking Dicyclomine with some relief. Pt stated that her BM pattern is not normal and she alternates between diarrhea and constipation. Chart reviewed and noted that pt has had several no shows. Pt to see PCP today. Pt was rescheduled to 04/22/2023 with Doug Sou PA. Pt notified of the importance of keeping the appointment.  Pt verbalized understanding with all questions answered.

## 2023-03-19 NOTE — Telephone Encounter (Signed)
Inbound call from patient states she is experiencing abdominal pain. Patient requesting to speak with a nurse to possible be prescribed a medication until she comes in for her upcoming ov. Please advise.

## 2023-03-19 NOTE — Telephone Encounter (Signed)
Pt stated that has been having abdominal pain and diarrhea for over a month.  Last BM today, Pt stated that she has been to the ER recently and taking tylenol throughout the day to relieve the pain. Pt was scheduled to see Quentin Mulling PA for an office visit on 03/28/2023 at 1:30 PM. Pt made aware.  Pt verbalized understanding with all questions answered.

## 2023-03-25 NOTE — Progress Notes (Unsigned)
03/28/2023 Teresa Hopkins [id number] 30-Jul-1951  Referring provider: Genia Hotter, FNP Primary GI doctor: Dr. Myrtie Neither  ASSESSMENT AND PLAN:   Lower abdominal pain per patient has only been the last 2 months, patient very tearful during interview Patient states different from previous lower abdominal pain but patient is been complaining of abdominal discomfort since 2016 Had unremarkable CT abdomen pelvis February 2024, showed aortic atherosclerosis, no evidence of mesenteric ischemia States she wakes up with pain, is afraid of eating due to lower abdominal pain pain and diarrhea.  Has not had any weight loss. Patient had previous colonoscopy with Dr. Bosie Clos, had diarrhea 2022 and was suggested for diagnostic colonoscopy but declined at that time. Will recheck stool culture, inflammatory markers, anemia, check pancreatic elastase with diarrhea worse with food. Will plan for diagnostic colonoscopy Patient prefers Sutab due to nausea or afraid of eating and drinking. We have discussed the risks of bleeding, infection, perforation, medication reactions, and remote risk of death associated with colonoscopy. All questions were answered and the patient acknowledges these risk and wishes to proceed. Tylenol helps the most set of all the medications, question possible musculoskeletal, IBS/anxiety  B12 deficiency Has macrocytosis, with symptoms and trigeminal neuralgia will check B12   Patient Care Team: Stamey, Verda Cumins, FNP as PCP - General (Family Medicine)  HISTORY OF PRESENT ILLNESS: 72 y.o. female with a past medical history of hyperlipidemia, osteoarthritis, generalized anxiety disorder, facial pain and others listed below presents for evaluation of AB pain.   Patient denies family history of colon cancer or other gastrointestinal malignancies. 01/10/2021 office visit with Alcide Evener, NP for diarrhea GI pathogen and C. difficile negative, fecal calprotectin  unremarkable 01/05/2021 CT AP normal stomach, small bowel cecum right mid abdomen otherwise colon was normal.  Tiny lesion in central uterus Recommended diagnostic colonoscopy however loose stools resolved. 12/28/2022 Ct AB and pelvis with contrast for AB pain no acute pathology, aortic atherosclerosis.  01/19/2023 CBC without anemia, infection, normal kidney liver.  Colonoscopy 2020 or earlier  with Dr. Bosie Clos.   She has had AB pain since middle to end of Feb, never had AB pain like this prior.  She states she wake up with AB pain, daily.  She take two 500 mg of tylenol that helps the AB pain the most, more than dicyclomine ( ER prescribed) or ibuprofen. Will be there when she first wakes.  Can last about 2 hours or can last all day.  Lower AB pain, no radiation, states 10/10 pain if not treated with tylenol.  States she is afraid to eat.  She states 2-3 hours after she eats, can be cereal to chicken, will have gurgling noises, nausea,  associated worsening AB pain.  She has 3-4 days without BM and then will have 1-2 bouts of diarrhea in the morning.  Has had decreased eating, no significant weight loss.  Has been on gabapentin for 3-4 weeks, for trigeminal neuralgia.  Patient very tearful during interview, states "cannot live like this anymore" denies depression or suicidal ideation.  Wt Readings from Last 6 Encounters:  03/28/23 175 lb 2 oz (79.4 kg)  01/29/23 177 lb 9.6 oz (80.6 kg)  07/30/22 176 lb 8 oz (80.1 kg)  01/10/21 179 lb (81.2 kg)  12/18/20 176 lb (79.8 kg)  01/14/20 180 lb (81.6 kg)    She  reports that she quit smoking about 22 years ago. Her smoking use included cigarettes. She has never used smokeless tobacco. She reports current  alcohol use. She reports that she does not use drugs.  RELEVANT LABS AND IMAGING: CBC    Component Value Date/Time   WBC 7.3 12/18/2020 1426   RBC 3.84 (L) 12/18/2020 1426   HGB 13.3 12/18/2020 1426   HCT 38.8 12/18/2020 1426   PLT  254.0 12/18/2020 1426   MCV 101.2 (H) 12/18/2020 1426   MCH 33.9 01/14/2020 1355   MCHC 34.3 12/18/2020 1426   RDW 12.5 12/18/2020 1426   LYMPHSABS 2.8 12/18/2020 1426   MONOABS 0.6 12/18/2020 1426   EOSABS 0.0 12/18/2020 1426   BASOSABS 0.0 12/18/2020 1426   No results for input(s): "HGB" in the last 8760 hours.  CMP     Component Value Date/Time   NA 140 12/18/2020 1426   K 3.8 12/18/2020 1426   CL 104 12/18/2020 1426   CO2 25 12/18/2020 1426   GLUCOSE 99 12/18/2020 1426   BUN 12 12/18/2020 1426   CREATININE 0.78 12/18/2020 1426   CALCIUM 9.7 12/18/2020 1426   PROT 7.7 12/18/2020 1426   ALBUMIN 4.4 12/18/2020 1426   AST 18 12/18/2020 1426   ALT 16 12/18/2020 1426   ALKPHOS 60 12/18/2020 1426   BILITOT 0.5 12/18/2020 1426   GFRNONAA >60 01/14/2020 1355   GFRAA >60 01/14/2020 1355      Latest Ref Rng & Units 12/18/2020    2:26 PM 01/14/2020    1:55 PM 06/21/2016    1:19 PM  Hepatic Function  Total Protein 6.0 - 8.3 g/dL 7.7  7.4  7.0   Albumin 3.5 - 5.2 g/dL 4.4  4.0  4.0   AST 0 - 37 U/L 18  20  24    ALT 0 - 35 U/L 16  18  22    Alk Phosphatase 39 - 117 U/L 60  55  56   Total Bilirubin 0.2 - 1.2 mg/dL 0.5  0.8  0.5       Current Medications:    Current Outpatient Medications (Cardiovascular):    simvastatin (ZOCOR) 20 MG tablet, Take 20 mg by mouth daily at 6 PM. (Patient not taking: Reported on 03/28/2023)     Current Outpatient Medications (Other):    dicyclomine (BENTYL) 20 MG tablet, Take 20 mg by mouth 4 (four) times daily -  before meals and at bedtime.   FLUoxetine (PROZAC) 40 MG capsule, Take 40 mg by mouth daily.    gabapentin (NEURONTIN) 300 MG capsule, Take 1 capsule (300 mg total) by mouth 3 (three) times daily. (Patient taking differently: Take 300 mg by mouth 2 (two) times daily.)   LORazepam (ATIVAN) 1 MG tablet, Take 1 mg by mouth 4 (four) times daily as needed (anxiety).    Na Sulfate-K Sulfate-Mg Sulf 17.5-3.13-1.6 GM/177ML SOLN, Take 1 kit  by mouth once for 1 dose.   tretinoin (RETIN-A) 0.1 % cream, Apply 1 application topically at bedtime.   Medical History:  Past Medical History:  Diagnosis Date   Acid reflux    Anxiety    Chronic back pain    Chronic facial pain    COVID-19    Depression    Gastritis    Hyperlipidemia    Hyperlipidemia    Trigeminal neuralgia    Allergies:  Allergies  Allergen Reactions   Lactose Intolerance (Gi) Other (See Comments)    Stomach aches     Surgical History:  She  has a past surgical history that includes Back surgery; fallopian tube and ovary surgery; Bunionectomy (Right); Lumbar laminectomy/decompression microdiscectomy (N/A, 06/12/2015);  Total hip arthroplasty (Left, 01/21/2020); Cataract extraction; and Abdominal hysterectomy. Family History:  Her family history includes Alzheimer's disease in her mother; Heart attack in her father.  REVIEW OF SYSTEMS  : Review of Systems  Constitutional:  Negative for chills, fever and weight loss.  Respiratory:  Negative for cough and shortness of breath.   Cardiovascular:  Negative for chest pain and leg swelling.  Gastrointestinal:  Positive for abdominal pain, constipation, diarrhea and nausea. Negative for blood in stool, heartburn, melena and vomiting.  Musculoskeletal:  Negative for back pain.  Skin:  Negative for rash.  Neurological:  Positive for headaches (from trigeninal neuraglia). Negative for weakness.     PHYSICAL EXAM: BP 100/60 (BP Location: Left Arm, Patient Position: Sitting, Cuff Size: Normal)   Pulse 68   Ht 5\' 6"  (1.676 m)   Wt 175 lb 2 oz (79.4 kg)   BMI 28.27 kg/m  General Appearance: Well nourished, in no apparent distress. Head:   Normocephalic and atraumatic. Eyes:  sclerae anicteric,conjunctive pink  Respiratory: Respiratory effort normal, BS equal bilaterally without rales, rhonchi, wheezing. Cardio: RRR with no MRGs. Peripheral pulses intact.  Abdomen: Soft,  Obese ,active bowel sounds. No  tenderness . Without guarding and Without rebound. No masses. Rectal: Not evaluated Musculoskeletal: Full ROM, Normal gait. Without edema. Skin:  Dry and intact without significant lesions or rashes Neuro: Alert and  oriented x4;  No focal deficits. Psych:  Cooperative. tearful mood and affect.    Doree Albee, PA-C 3:27 PM

## 2023-03-28 ENCOUNTER — Encounter: Payer: Self-pay | Admitting: Physician Assistant

## 2023-03-28 ENCOUNTER — Ambulatory Visit (INDEPENDENT_AMBULATORY_CARE_PROVIDER_SITE_OTHER): Payer: Medicare Other | Admitting: Physician Assistant

## 2023-03-28 VITALS — BP 100/60 | HR 68 | Ht 66.0 in | Wt 175.1 lb

## 2023-03-28 DIAGNOSIS — R1030 Lower abdominal pain, unspecified: Secondary | ICD-10-CM

## 2023-03-28 DIAGNOSIS — E538 Deficiency of other specified B group vitamins: Secondary | ICD-10-CM | POA: Diagnosis not present

## 2023-03-28 DIAGNOSIS — A09 Infectious gastroenteritis and colitis, unspecified: Secondary | ICD-10-CM | POA: Diagnosis not present

## 2023-03-28 MED ORDER — NA SULFATE-K SULFATE-MG SULF 17.5-3.13-1.6 GM/177ML PO SOLN: 1.0000 | Freq: Once | ORAL | 0 refills | Status: AC

## 2023-03-28 NOTE — Patient Instructions (Addendum)
Your provider has requested that you go to the basement level for lab work before leaving today. Press "B" on the elevator. The lab is located at the first door on the left as you exit the elevator.  No aleve, ibuprofen, goody powders, as these are antiinflammatories and can cause inflammation in your stomach, increase bleeding risk and cause ulcers.  You can talk with PCP about alternative pain options.  Can do tyelnol max 3000 mg a day, salon pas patches are over the counter and voltern gel is topical antiinflammatory that is safe.   First do a trial off milk/lactose products if you use them.  Add fiber like benefiber or citracel once a day Increase activity Can do trial of IBGard which is over the counter for AB pain- Take 1-2 capsules once a day for maintence or twice a day during a flare Please try to decrease stress. consider talking with PCP about anti anxiety medication or try head space app for meditation. if any worsening symptoms like blood in stool, weight loss, please call the office   _______________________________________________________  If your blood pressure at your visit was 140/90 or greater, please contact your primary care physician to follow up on this.  _______________________________________________________  If you are age 7 or older, your body mass index should be between 23-30. Your Body mass index is 28.27 kg/m. If this is out of the aforementioned range listed, please consider follow up with your Primary Care Provider.  If you are age 63 or younger, your body mass index should be between 19-25. Your Body mass index is 28.27 kg/m. If this is out of the aformentioned range listed, please consider follow up with your Primary Care Provider.   ________________________________________________________  The [facility] providers would like to encourage you to use [facility] to communicate with providers for non-urgent requests or questions.  Due to long hold times on the  telephone, sending your provider a message by [facility] may be a faster and more efficient way to get a response.  Please allow 48 business hours for a response.  Please remember that this is for non-urgent requests.  _______________________________________________________ It was a pleasure to see you today!  Thank you for trusting me with your gastrointestinal care!

## 2023-03-30 DIAGNOSIS — R197 Diarrhea, unspecified: Secondary | ICD-10-CM | POA: Diagnosis not present

## 2023-03-30 DIAGNOSIS — I700 Atherosclerosis of aorta: Secondary | ICD-10-CM | POA: Diagnosis not present

## 2023-03-30 DIAGNOSIS — R109 Unspecified abdominal pain: Secondary | ICD-10-CM | POA: Diagnosis not present

## 2023-03-30 DIAGNOSIS — E785 Hyperlipidemia, unspecified: Secondary | ICD-10-CM | POA: Diagnosis not present

## 2023-03-30 DIAGNOSIS — Z9889 Other specified postprocedural states: Secondary | ICD-10-CM | POA: Diagnosis not present

## 2023-03-30 DIAGNOSIS — F419 Anxiety disorder, unspecified: Secondary | ICD-10-CM | POA: Diagnosis not present

## 2023-03-30 DIAGNOSIS — Z90710 Acquired absence of both cervix and uterus: Secondary | ICD-10-CM | POA: Diagnosis not present

## 2023-03-30 DIAGNOSIS — R1031 Right lower quadrant pain: Secondary | ICD-10-CM | POA: Diagnosis not present

## 2023-03-30 DIAGNOSIS — R1030 Lower abdominal pain, unspecified: Secondary | ICD-10-CM | POA: Diagnosis not present

## 2023-04-01 ENCOUNTER — Telehealth: Payer: Self-pay | Admitting: Physician Assistant

## 2023-04-01 NOTE — Progress Notes (Signed)
____________________________________________________________  Attending physician addendum:  Thank you for sending this case to me. I have reviewed the entire note and agree with the plan for diagnostic colonoscopy.  In addition, see subsequent notes indicating visit to an outside ED on 03/30/23 and the GI phone note indicates patient transferring care to have a GI practice.  Amada Jupiter, MD  ____________________________________________________________

## 2023-04-01 NOTE — Telephone Encounter (Signed)
Returned pts call and she stated to cancel her appts as she is transferring her care to [place]. Appt cancelled per pt request.

## 2023-04-01 NOTE — Telephone Encounter (Signed)
Inbound call from patient stating she is experiencing severe abdominal pain and would like a prescription sent into the pharmacy. Please advise.  Thank you

## 2023-04-04 DIAGNOSIS — Z79899 Other long term (current) drug therapy: Secondary | ICD-10-CM | POA: Diagnosis not present

## 2023-04-04 DIAGNOSIS — R197 Diarrhea, unspecified: Secondary | ICD-10-CM | POA: Diagnosis not present

## 2023-04-04 DIAGNOSIS — R1030 Lower abdominal pain, unspecified: Secondary | ICD-10-CM | POA: Diagnosis not present

## 2023-04-22 ENCOUNTER — Ambulatory Visit: Payer: Medicare Other | Admitting: Gastroenterology

## 2023-04-23 DIAGNOSIS — F4323 Adjustment disorder with mixed anxiety and depressed mood: Secondary | ICD-10-CM | POA: Diagnosis not present

## 2023-04-30 DIAGNOSIS — F4323 Adjustment disorder with mixed anxiety and depressed mood: Secondary | ICD-10-CM | POA: Diagnosis not present

## 2023-05-13 DIAGNOSIS — F4323 Adjustment disorder with mixed anxiety and depressed mood: Secondary | ICD-10-CM | POA: Diagnosis not present

## 2023-05-19 ENCOUNTER — Ambulatory Visit: Payer: Medicare Other | Admitting: Gastroenterology

## 2023-05-27 ENCOUNTER — Encounter: Payer: Medicare Other | Admitting: Internal Medicine

## 2023-08-11 ENCOUNTER — Ambulatory Visit: Payer: Medicare Other | Admitting: Family Medicine

## 2023-08-12 DIAGNOSIS — I781 Nevus, non-neoplastic: Secondary | ICD-10-CM | POA: Diagnosis not present

## 2023-08-12 DIAGNOSIS — L578 Other skin changes due to chronic exposure to nonionizing radiation: Secondary | ICD-10-CM | POA: Diagnosis not present

## 2023-08-12 DIAGNOSIS — L821 Other seborrheic keratosis: Secondary | ICD-10-CM | POA: Diagnosis not present

## 2023-09-02 ENCOUNTER — Telehealth: Payer: Self-pay | Admitting: Family Medicine

## 2023-09-02 DIAGNOSIS — G509 Disorder of trigeminal nerve, unspecified: Secondary | ICD-10-CM

## 2023-09-02 DIAGNOSIS — R519 Headache, unspecified: Secondary | ICD-10-CM

## 2023-09-02 DIAGNOSIS — J3489 Other specified disorders of nose and nasal sinuses: Secondary | ICD-10-CM

## 2023-09-02 NOTE — Telephone Encounter (Signed)
Pt called needing to speak to the RN regarding her gabapentin (NEURONTIN) 300 MG capsule she states that no matter how many she takes and no matter if she takes Tylenol the pain will not stop. Please advise.

## 2023-09-02 NOTE — Telephone Encounter (Signed)
Called pt back. Relayed message from Amy. She is agreeable to referral to pain clinic. I placed.  Aware that because we have documentation that she had severe stomach cramping from oxcarbazepine in the past, we would not recommend this (pt asking if she could try again, she had bottle at home)

## 2023-09-02 NOTE — Telephone Encounter (Signed)
Pt last seen 01/29/23. Looks like pt cx her f/u 08/11/23. Has no f/u scheduled.   Dx: Chronic idiopathic facial pain  Prescribed: gabapentin 300mg  up to three times daily  I called pt. She started having increased pain 5 days ago. Pain came on suddenly. Pain located in her nostrils. Gabapentin ineffective in managing pain. Tylenol 500mg  ineffective.  Advil ineffective. Denies any injuries prior to pain starting. Denies any recent or current infection/illness. Did not eat anything to cause sx.   She cannot come tomorrow at 8:30 am with Dr. Epimenio Foot. Aware I will send to AL,NP to review for recommendation and will call back this afternoon

## 2023-09-03 ENCOUNTER — Encounter: Payer: Self-pay | Admitting: Family Medicine

## 2023-09-03 ENCOUNTER — Telehealth: Payer: Self-pay | Admitting: Family Medicine

## 2023-09-03 NOTE — Telephone Encounter (Signed)
Urgent referral sent to [facility], phone # [phone].

## 2023-09-03 NOTE — Telephone Encounter (Signed)
Pt called stating that she was to be advised on how to take care of the pain till she is in with pain management.

## 2023-09-03 NOTE — Telephone Encounter (Signed)
Teresa Hopkins- I spent a lot of time yesterday on the phone with her explaining she has tried/failed options and next step is pain management.

## 2023-09-09 ENCOUNTER — Telehealth: Payer: Self-pay | Admitting: Neurology

## 2023-09-09 MED ORDER — PREDNISONE 10 MG PO TABS: ORAL_TABLET | ORAL | 0 refills | Status: DC

## 2023-09-09 NOTE — Telephone Encounter (Signed)
  Patient reached out per MyChart last day, now contacting on- call MD for severe facial pain, present for over a week .  Known DX and already referred to pain specialist clinic, but no appointment yet set.  Takes gabapentin 300 mg 6 a day, divided tid with less effect, but very sedating sideffect .  We spoke about a prednisone taper to calm the nerve pain, she needs to consider tegretol/ oxcarbazepine . Has tried Lyrica already.  I send in a prednisone dose pack , see phone note.  CD

## 2023-09-10 NOTE — Telephone Encounter (Signed)
Called pt. She asked if prednisone was called in. I reviewed and relayed Dr. Vickey Huger called in prednisone yesterday to [facility] pharmacy. She will pick up today. She will call pain clinic back to schedule appointment. She will then call us back to set up next available appt with Amy Lomax,NP (she requested to do this). She is hesitant to see pain clinic, feels they won't understand her pain/know how to treat. Encouraged her to set up appt and be seen to see what they say first before deciding.

## 2023-09-10 NOTE — Telephone Encounter (Signed)
Called pt. She believes she missed call from pain clinic yesterday, needs to call back.   Relayed Amy's message. Offered today at 1pm with AL,NP. Pt states she cannot come into the office d/t husband being out of town. Willing to do VV though. Advised I will have to confirm with NP first if this is ok and will call her back. She verbalized understanding.

## 2023-09-30 ENCOUNTER — Telehealth: Payer: Self-pay | Admitting: Family Medicine

## 2023-09-30 MED ORDER — GABAPENTIN 300 MG PO CAPS: 300.0000 mg | ORAL_CAPSULE | Freq: Three times a day (TID) | ORAL | 5 refills | Status: DC

## 2023-09-30 NOTE — Telephone Encounter (Signed)
Pt is requesting a refill for gabapentin (NEURONTIN) 300 MG capsule .  Pharmacy:  [facility]

## 2023-09-30 NOTE — Telephone Encounter (Signed)
Last seen on 01/29/23 Follow up scheduled on 04/14/24 Rx sent

## 2023-10-09 NOTE — Telephone Encounter (Addendum)
I called patient to discuss the below message from phone room. Pt states her facial pain has worsened. I explained that at last office visit it was documented that her facial pain has improved.   Last seen on 01/29/23 " Teresa Hopkins reports facial pain has improved on gabapentin. She may continue 300mg  up to three times daily."  Patient said at the time it had improved, but has worsened, states even with the 6 tablets of gabapentin she still has pain and has to add OTC medication. She is scheduled on 04/14/24 for follow up visit. Patient said the 6 tablets is an old Rx that Dr. Delena Bali prescribed.   I advise an office visit  best, patient can see Dr. Epimenio Foot on 10/16/23 @ 2pm told to arrive at 1:30pm to check in.

## 2023-10-09 NOTE — Telephone Encounter (Signed)
Pt states because she takes 6 gabapentin (NEURONTIN) 300 MG capsule a day 180 should have been called in for her 30 day.  Pt is asking if 90 extra can be called into [facility]

## 2023-10-16 ENCOUNTER — Ambulatory Visit: Payer: Medicare Other | Admitting: Neurology

## 2023-10-16 ENCOUNTER — Encounter: Payer: Self-pay | Admitting: Neurology

## 2023-10-16 VITALS — BP 113/66 | HR 73 | Ht 67.0 in | Wt 181.0 lb

## 2023-10-16 DIAGNOSIS — R519 Headache, unspecified: Secondary | ICD-10-CM | POA: Diagnosis not present

## 2023-10-16 DIAGNOSIS — R208 Other disturbances of skin sensation: Secondary | ICD-10-CM

## 2023-10-16 DIAGNOSIS — J3489 Other specified disorders of nose and nasal sinuses: Secondary | ICD-10-CM | POA: Diagnosis not present

## 2023-10-16 DIAGNOSIS — G8929 Other chronic pain: Secondary | ICD-10-CM

## 2023-10-16 NOTE — Progress Notes (Signed)
[facility]  PATIENT: Teresa Hopkins DOB: 04/01/1951  REFERRING DOCTOR OR PCP:  Teresa Heller FNP SOURCE: patient, notes from Teresa Hopkins and Teresa Hopkins, imaging and lab reports, MRI images personally reviewed  _________________________________   HISTORICAL  CHIEF COMPLAINT:  Chief Complaint  Patient presents with   Follow-up    Pt in room 10 alone. Here for diopathic facial pain follow up. Pt said facial pain is better some days and bad some days.  Taking Gabapentin.    HISTORY OF PRESENT ILLNESS:  Teresa Hopkins is a 72 y.o. woman with atypical facial pain  Update 10/16/2023 She is on gabapentin and titrated up to 300-600 mg po tid (up to 6 pills) for her atypical facial pain.   Pain is bilateral in and around the nose more than in th cheek.   She felt it helped her initially at a higher dose but now is not helping her much.  She takes acetaminophen x 6 a day as well.   Currently, pain is severe (8-9) during the day and then does better at night (down to 3).  She did try oxcarbazepine but had stomach pain after the first dose and stopped.  However, she was having stomach pain at the time and also other bowel symptoms.   She was started on dicyclomine and stomach issue are better  She also has anxiety and is on fluoxetine and lorazepam.    MRI of the brain and face was personally reviewed.  They did not show an etiology of her symptoms.       HISTORY (copied from Teresa Hopkins previous note and Teresa Hopkins's note)  The patient presents for evaluation of facial pain which began 3 months ago. About 3 months ago she had a tooth implant in her right upper gum. Otherwise nothing out of the ordinary occurred around that time. No recent illnesses or head trauma.    She describes the pain as constant burning pain in her nostrils. Predominantly on the right side but can be on the left as well. Sometimes pain will become so bad that she will develop a frontal headache. This is  associated with phonophobia and osmophobia, no photophobia or nausea. No vision changes. She has tried saline flushes which irritated her sinuses. Takes hydrocodone as needed which relieves the pain for 3-4 hours, but then pain returns. Advil and ibuprofen do not help.   Saw ENT who ordered CT sinus 03/26/22 showed a right nasal septal deviation and left bony spur but no sign of active sinusitis.   Recent eye exam was normal.   FACIAL PAIN FEATURES  Side: right and left Distribution: nostrils Any pain on side or back of head: yes Character: burning Duration: constant Pain-free between episodes: no Triggers: strong smells, touching face Sensory abnormalities: no Tried Tegretol/Trileptal: no Prior procedures/outcome: no History of MS, Lyme's disease, facial rash: no History of dental/oral surgery, facial/plastic surgery: dental implant on right   Current Treatment: Abortive hydrocodone   Preventative none   Prior Therapies                                 Fluoxetine 40 mg daily Tylenol Hydrocodone  REVIEW OF SYSTEMS: Constitutional: No fevers, chills, sweats, or change in appetite Eyes: No visual changes, double vision, eye pain Ear, nose and throat: No hearing loss, ear pain, nasal congestion, sore throat Cardiovascular: No chest pain, palpitations Respiratory:  No shortness of  breath at rest or with exertion.   No wheezes GastrointestinaI: No nausea, vomiting, diarrhea, abdominal pain, fecal incontinence Genitourinary:  No dysuria, urinary retention or frequency.  No nocturia. Musculoskeletal:  No neck pain, back pain Integumentary: No rash, pruritus, skin lesions Neurological: as above Psychiatric: No depression at this time.  No anxiety Endocrine: No palpitations, diaphoresis, change in appetite, change in weigh or increased thirst Hematologic/Lymphatic:  No anemia, purpura, petechiae. Allergic/Immunologic: No itchy/runny eyes, nasal congestion, recent allergic  reactions, rashes  ALLERGIES: Allergies  Allergen Reactions   Lactose Intolerance (Gi) Other (See Comments)    Stomach aches    HOME MEDICATIONS:  Current Outpatient Medications:    dicyclomine (BENTYL) 20 MG tablet, Take 20 mg by mouth 4 (four) times daily -  before meals and at bedtime., Disp: , Rfl:    FLUoxetine (PROZAC) 40 MG capsule, Take 40 mg by mouth daily. , Disp: , Rfl:    gabapentin (NEURONTIN) 300 MG capsule, Take 1 capsule (300 mg total) by mouth 3 (three) times daily., Disp: 90 capsule, Rfl: 5   LORazepam (ATIVAN) 1 MG tablet, Take 1 mg by mouth 4 (four) times daily as needed (anxiety). , Disp: , Rfl:    tretinoin (RETIN-A) 0.1 % cream, Apply 1 application topically at bedtime. , Disp: , Rfl:    predniSONE (DELTASONE) 10 MG tablet, Take 4 tab in AM for 3 days , follow with 3 tabs in AM for 3 d, 2 tab AM for 3 d, then 1 tab in AM for 7 days (Patient not taking: Reported on 10/16/2023), Disp: 35 tablet, Rfl: 0   simvastatin (ZOCOR) 20 MG tablet, Take 20 mg by mouth daily at 6 PM. (Patient not taking: Reported on 03/28/2023), Disp: , Rfl:   PAST MEDICAL HISTORY: Past Medical History:  Diagnosis Date   Acid reflux    Anxiety    Chronic back pain    Chronic facial pain    COVID-19    Depression    Gastritis    Hyperlipidemia    Hyperlipidemia    Trigeminal neuralgia     PAST SURGICAL HISTORY: Past Surgical History:  Procedure Laterality Date   ABDOMINAL HYSTERECTOMY     BACK SURGERY     microdiskectomy - 03/2015   BUNIONECTOMY Right    CATARACT EXTRACTION     fallopian tube and ovary surgery     LUMBAR LAMINECTOMY/DECOMPRESSION MICRODISCECTOMY N/A 06/12/2015   Procedure: redo L45 microdiskectomy;  Surgeon: Teresa Memos, MD;  Location: [facility];  Service: Neurosurgery;  Laterality: N/A;  redo L45 microdiskectomy   TOTAL HIP ARTHROPLASTY Left 01/21/2020   Procedure: TOTAL HIP ARTHROPLASTY ANTERIOR APPROACH;  Surgeon: Teresa Geralds, MD;  Location: [facility];   Service: Orthopedics;  Laterality: Left;    FAMILY HISTORY: Family History  Problem Relation Age of Onset   Alzheimer's disease Mother    Heart attack Father     SOCIAL HISTORY: Social History   Socioeconomic History   Marital status: Married    Spouse name: Not on file   Number of children: Not on file   Years of education: Not on file   Highest education level: Not on file  Occupational History   Not on file  Tobacco Use   Smoking status: Former    Current packs/day: 0.00    Types: Cigarettes    Quit date: 06/08/2000    Years since quitting: 23.3   Smokeless tobacco: Never  Vaping Use   Vaping status: Never Used  Substance  and Sexual Activity   Alcohol use: Yes    Comment: occasional   Drug use: No   Sexual activity: Not on file  Other Topics Concern   Not on file  Social History Narrative   Not on file   Social Drivers of Health   Financial Resource Strain: Not on file  Food Insecurity: Not on file  Transportation Needs: Not on file  Physical Activity: Not on file  Stress: Not on file  Social Connections: Unknown (03/18/2022)   Received from [facility]   Social Network    Social Network: Not on file  Intimate Partner Violence: Not At Risk (04/04/2023)   Received from [facility], [facility]   HITS    Over the last 12 months how often did your partner physically hurt you?: Never    Over the last 12 months how often did your partner insult you or talk down to you?: Never    Over the last 12 months how often did your partner threaten you with physical harm?: Never    Over the last 12 months how often did your partner scream or curse at you?: Never       PHYSICAL EXAM  Vitals:   10/16/23 1357  BP: 113/66  Pulse: 73  Weight: 181 lb (82.1 kg)  Height: 5\' 7"  (1.702 m)    Body mass index is 28.35 kg/m.   General: The patient is well-developed and well-nourished and in no acute distress  HEENT:  Head is [place]/AT.  Sclera are  anicteric.     Skin: Extremities are without rash or  edema.  Musculoskeletal:  Back is nontender  Neurologic Exam  Mental status: The patient is alert and oriented x 3 at the time of the examination. The patient has apparent normal recent and remote memory, with an apparently normal attention span and concentration ability.   Speech is normal.  Cranial nerves: Extraocular movements are full. Pupils are equal, round, and reactive to light and accomodation.   Facial symmetry is present. There is good facial sensation to soft touch bilaterally.Facial strength is normal.  Trapezius and sternocleidomastoid strength is normal. No dysarthria is noted.  The tongue is midline, and the patient has symmetric elevation of the soft palate. No obvious hearing deficits are noted.  Motor:  Muscle bulk is normal.   Tone is normal. Strength is  5 / 5 in all 4 extremities.   Sensory: Sensory testing is intact to  soft touch and vibration sensation     Gait and station: Station is normal.   Gait is normal.    Reflexes: Deep tendon reflexes are symmetric and normal bilaterally.      DIAGNOSTIC DATA (LABS, IMAGING, TESTING) - I reviewed patient records, labs, notes, testing and imaging myself where available.  Lab Results  Component Value Date   WBC 7.3 12/18/2020   HGB 13.3 12/18/2020   HCT 38.8 12/18/2020   MCV 101.2 (H) 12/18/2020   PLT 254.0 12/18/2020      Component Value Date/Time   NA 140 12/18/2020 1426   K 3.8 12/18/2020 1426   CL 104 12/18/2020 1426   CO2 25 12/18/2020 1426   GLUCOSE 99 12/18/2020 1426   BUN 12 12/18/2020 1426   CREATININE 0.78 12/18/2020 1426   CALCIUM 9.7 12/18/2020 1426   PROT 7.7 12/18/2020 1426   ALBUMIN 4.4 12/18/2020 1426   AST 18 12/18/2020 1426   ALT 16 12/18/2020 1426   ALKPHOS 60 12/18/2020 1426  BILITOT 0.5 12/18/2020 1426   GFRNONAA >60 01/14/2020 1355   GFRAA >60 01/14/2020 1355   No results found for: "CHOL", "HDL", "LDLCALC", "LDLDIRECT",  "TRIG", "CHOLHDL" No results found for: "HGBA1C" No results found for: "VITAMINB12" Lab Results  Component Value Date   TSH 2.14 12/18/2020       ASSESSMENT AND PLAN  Chronic idiopathic facial pain  Pain of nose  Dysesthesia  Etiology of her facial pain is not certain.  It is not typical of trigeminal neuralgia.   Gabapentin is not helping her atypical facial pain much.  She had only tried 1 pill of oxcarbazepine.  I have advised her to initiate the 150 mg tablets 1 twice a day.  If after 2 weeks if she is no better she should go up to 2 twice a day and let us know if there is no improvement after 3 or 4 weeks.  If that happens, while on 300 mg twice a day, then we can discontinue and do a trial of lamotrigine.  If she gets partial benefit from oxcarbazepine at either 150 or 300 twice a day, we can increase the dose further. Return in 6 months or sooner if there are new or worsening neurologic symptoms  Darcelle Herrada A. Epimenio Foot, MD, [facility] 10/16/2023, 2:23 PM Certified in Neurology, Clinical Neurophysiology, Sleep Medicine and Neuroimaging  [facility] [address] [place], [place] [address] [phone]

## 2023-10-16 NOTE — Patient Instructions (Signed)
Take oxcarbazepine one pill twice a day x 2 weeks If not better after two weeks,  increase  to two pills twice a day.  Call us first week in January if not helping any

## 2023-10-18 ENCOUNTER — Encounter: Payer: Self-pay | Admitting: Neurology

## 2023-10-20 ENCOUNTER — Other Ambulatory Visit: Payer: Self-pay | Admitting: Neurology

## 2023-10-20 MED ORDER — TRAMADOL HCL 50 MG PO TABS: 50.0000 mg | ORAL_TABLET | Freq: Three times a day (TID) | ORAL | 1 refills | Status: DC | PRN

## 2023-10-31 ENCOUNTER — Other Ambulatory Visit: Payer: Self-pay | Admitting: Neurology

## 2023-11-17 ENCOUNTER — Other Ambulatory Visit: Payer: Self-pay | Admitting: Neurology

## 2023-11-17 MED ORDER — OXCARBAZEPINE 150 MG PO TABS: 150.0000 mg | ORAL_TABLET | Freq: Two times a day (BID) | ORAL | 11 refills | Status: DC

## 2023-11-20 ENCOUNTER — Telehealth: Payer: Self-pay | Admitting: Pharmacy Technician

## 2023-11-20 ENCOUNTER — Other Ambulatory Visit (HOSPITAL_COMMUNITY): Payer: Self-pay

## 2023-11-20 NOTE — Telephone Encounter (Signed)
Pharmacy Patient Advocate Encounter   Received notification from CoverMyMeds that prior authorization for traMADol HCl 50MG  tablets is required/requested.   Insurance verification completed.   The patient is insured through [facility] .   Per test claim: PA required; PA started via CoverMyMeds. KEY [id number] . Waiting for clinical questions to populate.

## 2023-11-21 ENCOUNTER — Telehealth: Payer: Self-pay | Admitting: Neurology

## 2023-11-21 NOTE — Telephone Encounter (Signed)
Lea from [employer] PA dept is asking for additional information from clinical staff re: the  traMADol (ULTRAM) 50 MG tablet  Case [id number] request will be auto denied on 01-19 at 2:31 am central stand time if no response received by then

## 2023-11-21 NOTE — Telephone Encounter (Signed)
Called pharmacy and did verbal PA for pt's Tramadol 50 mg.

## 2023-11-24 NOTE — Telephone Encounter (Signed)
PA was Approved on 11/21/2023  [id number]

## 2023-11-25 NOTE — Telephone Encounter (Signed)
   Insurance-due to PT age and taking Lorazepam and adding Tramadol are needing this answered by provider please. Thanks!

## 2023-11-26 NOTE — Telephone Encounter (Signed)
Pharmacy Patient Advocate Encounter  Received notification from [facility] that Prior Authorization for Tramadol has been APPROVED from 11/21/23 to 11/03/24   PA #/Case ID/Reference #: PA Case ID #: [id number]

## 2023-11-27 ENCOUNTER — Other Ambulatory Visit: Payer: Self-pay | Admitting: Neurology

## 2023-11-27 MED ORDER — TRAMADOL HCL 50 MG PO TABS: 50.0000 mg | ORAL_TABLET | Freq: Two times a day (BID) | ORAL | 1 refills | Status: DC | PRN

## 2023-11-28 DIAGNOSIS — T4275XA Adverse effect of unspecified antiepileptic and sedative-hypnotic drugs, initial encounter: Secondary | ICD-10-CM | POA: Diagnosis not present

## 2023-11-28 DIAGNOSIS — R109 Unspecified abdominal pain: Secondary | ICD-10-CM | POA: Diagnosis not present

## 2023-11-28 DIAGNOSIS — F411 Generalized anxiety disorder: Secondary | ICD-10-CM | POA: Diagnosis not present

## 2023-12-04 ENCOUNTER — Telehealth: Payer: Self-pay | Admitting: Neurology

## 2023-12-04 NOTE — Telephone Encounter (Addendum)
Spoke w/ Dr. Epimenio Foot. He recommends she d/c oxcarbazepine and start on Vimpat 100mg  po BID. She should take 1 po every day x3 days and then go up to full dose thereafter.   LVM asking pt to call office.

## 2023-12-04 NOTE — Telephone Encounter (Signed)
Pt said, Take OXcarbazepine (TRILEPTAL) 150 MG tablet Take after dinner and take in the morning. Waking up in the morning with a cramp,, nausea. Stomach hurts so bad have began to vomit.  Taking Tyelnol,Ibuprofen do not get any relief. Have not went to the ER. Does help with the  facial  pain. Would like a call from the nurse

## 2023-12-09 NOTE — Telephone Encounter (Signed)
Replied to pt mychart message.

## 2023-12-10 ENCOUNTER — Telehealth: Payer: Self-pay | Admitting: Neurology

## 2023-12-10 NOTE — Telephone Encounter (Addendum)
 Received a faxed message from [facility] regarding referral faxed on 09/03/2023: We are writing in response to the referral for [facility]. After careful review of the provided documentation, we regret to inform you that we are unable to process the referral at this time due to the following reasons:  Patient Did Not Respond  We value the trust you place in our clinic to provide care for your patients. To facilitate a successful referral, we kindly request solving the reason for rejection showed if possible. Please feel free to call [phone] if you have any questions. We sincerely appreciate the confidence you have placed in us  with this referral. Thank you!  Left a voicemail for patient to contact [facility].

## 2023-12-22 ENCOUNTER — Ambulatory Visit: Payer: Medicare Other

## 2023-12-24 ENCOUNTER — Other Ambulatory Visit: Payer: Self-pay | Admitting: Neurology

## 2023-12-24 ENCOUNTER — Telehealth: Payer: Self-pay | Admitting: Neurology

## 2023-12-24 MED ORDER — LACOSAMIDE 50 MG PO TABS: 50.0000 mg | ORAL_TABLET | Freq: Two times a day (BID) | ORAL | 5 refills | Status: DC

## 2023-12-24 NOTE — Telephone Encounter (Signed)
Spoke to patient Per Dr Epimenio Foot please stop the OXcarbazepine (TRILEPTAL) 150 MG tablet   Dr Epimenio Foot has sent in a new prescription for Vimpat 50mg  2x daily to help with facial pain . Pt states she thinks nausea and headaches are due to  oxcarbazepine . Informed pt if she continues to have nausea and headaches please call us  back at our office or contact PCP Pt expressed understanding and thanked me for calling

## 2023-12-24 NOTE — Telephone Encounter (Signed)
Pt called stating that she is needing to speak to the RN or MD to discuss sever headaches and stomach pain that she is experiencing ever since she started OXcarbazepine (TRILEPTAL) 150 MG tablet  Please advise.

## 2023-12-24 NOTE — Telephone Encounter (Signed)
Spoke to patient states start taking OXcarbazepine (TRILEPTAL) 150 MG tablet  11/2023 . Pt states for the last 3 weeks stomach pain ,nausea,and headaches , Pt states wakes up in pain and last throughout the day Pt  states at times does take tylenol does subside the pain for a short period of time .Pt  was taking Gabapentin for Chronic idiopathic facial pain . Gabapentin stopped working and changed to oxcarbazepine Pt was tearful Will forward to Dr Epimenio Foot Pt states still taking medication . Pt expressed understanding and thanked me for calling  .

## 2023-12-26 DIAGNOSIS — M216X1 Other acquired deformities of right foot: Secondary | ICD-10-CM | POA: Diagnosis not present

## 2023-12-26 DIAGNOSIS — M2011 Hallux valgus (acquired), right foot: Secondary | ICD-10-CM | POA: Diagnosis not present

## 2023-12-26 DIAGNOSIS — M2041 Other hammer toe(s) (acquired), right foot: Secondary | ICD-10-CM | POA: Diagnosis not present

## 2023-12-26 DIAGNOSIS — M7741 Metatarsalgia, right foot: Secondary | ICD-10-CM | POA: Diagnosis not present

## 2024-01-02 ENCOUNTER — Ambulatory Visit (HOSPITAL_COMMUNITY)
Admission: EM | Admit: 2024-01-02 | Discharge: 2024-01-02 | Disposition: A | Discharge status: Home / Self Care | Payer: Medicare Other | Attending: Psychiatry | Admitting: Psychiatry

## 2024-01-02 DIAGNOSIS — G501 Atypical facial pain: Secondary | ICD-10-CM | POA: Insufficient documentation

## 2024-01-02 DIAGNOSIS — F32A Depression, unspecified: Secondary | ICD-10-CM | POA: Insufficient documentation

## 2024-01-02 DIAGNOSIS — F411 Generalized anxiety disorder: Secondary | ICD-10-CM | POA: Insufficient documentation

## 2024-01-02 DIAGNOSIS — G8929 Other chronic pain: Secondary | ICD-10-CM | POA: Diagnosis not present

## 2024-01-02 DIAGNOSIS — Z79899 Other long term (current) drug therapy: Secondary | ICD-10-CM | POA: Insufficient documentation

## 2024-01-02 NOTE — Progress Notes (Signed)
   01/02/24 1045  [facility] Triage Screening (Walk-ins at [facility] only)  How Did You Hear About [place]? Family/Friend  What Is the Reason for Your Visit/Call Today? Newmann is a 73 year old female presenting to [facility] accompanied by her husband. Pt reports she has been "sick". Pt reports she has an illness connected to her nerves and has been put on medication back and forth. Pt mentions she could not stop crying this morning. Pt reports ongoing abdominal pain and was told it was due to her nerves. Pt mentions that her medication is making her feel "sad". Pt also mentions she has been having headaches and has not been herself. Pt is currently taking medication for anxiety and depression and is looking to come off of all of her medication. Pt denies seeing a therpaist at this time and is not interested in finding one at this time. Pt is looking for a schedule to get off of her medication and evaluate what the problem is. Pt denies substance use, Si, Hi and Avh.  How Long Has This Been Causing You Problems? 1 wk - 1 month  Have You Recently Had Any Thoughts About Hurting Yourself? No  Are You Planning to Commit Suicide/Harm Yourself At This time? No  Have you Recently Had Thoughts About Hurting Someone [name]? No  Are You Planning To Harm Someone At This Time? No  Physical Abuse Denies  Verbal Abuse Denies  Sexual Abuse Denies  Exploitation of patient/patient's resources Denies  Self-Neglect Denies  Possible abuse reported to: Other (Comment)  Are you currently experiencing any auditory, visual or other hallucinations? No  Have You Used Any Alcohol or Drugs in the Past 24 Hours? No  Do you have any current medical co-morbidities that require immediate attention? No  Clinician description of patient physical appearance/behavior: tearful, anxious, cooperative  What Do You Feel Would Help You the Most Today? Stress Management  If access to [facility] was not available, would you have sought care in the Emergency  Department? No  Determination of Need Routine (7 days)  Options For Referral Outpatient Therapy;Medication Management

## 2024-01-02 NOTE — Discharge Instructions (Signed)
 Discharge recommendations:   Medications: Patient is to take medications as prescribed. Please do not abruptly discontinue any of your current medications without discussing with provider. The patient or patient's guardian is to contact a medical professional and/or outpatient provider to address any new side effects that develop. The patient or the patient's guardian should update outpatient providers of any new medications and/or medication changes.    Outpatient Follow up: Please go to your schedule appointment at [facility] on March 10.2025 at 10:00 am.  Please follow up with your primary care provider for all medical related needs.   Therapy: We recommend that patient participate in individual therapy to address mental health concerns.   Safety:   The following safety precautions should be taken:   No sharp objects. This includes scissors, razors, scrapers, and putty knives.   Chemicals should be removed and locked up.   Medications should be removed and locked up.   Weapons should be removed and locked up. This includes firearms, knives and instruments that can be used to cause injury.   The patient should abstain from use of illicit substances/drugs and abuse of any medications.  If symptoms worsen or do not continue to improve or if the patient becomes actively suicidal or homicidal then it is recommended that the patient return to the closest hospital emergency department, the [facility], or call 911 for further evaluation and treatment. National Suicide Prevention Lifeline 1-800-SUICIDE or (425)205-1875.  About 988 988 offers 24/7 access to trained crisis counselors who can help people experiencing mental health-related distress. People can call or text 988 or chat 988lifeline.org for themselves or if they are worried about a loved one who may need crisis support

## 2024-01-02 NOTE — ED Notes (Signed)
 Patient discharged by provider. AVS given

## 2024-01-02 NOTE — ED Provider Notes (Signed)
 Behavioral Health Urgent Care Medical Screening Exam  Patient Name: Teresa Hopkins MRN: [id number] Date of Evaluation: 01/02/24 Chief Complaint:  "I just want to know if I can stop my meds".  Diagnosis:  Final diagnoses:  Generalized anxiety disorder    History of Present illness: Teresa Hopkins 73 y.o., female patient presented to [facility] as a voluntary walk in accompanied by her husband with complaints of anxiety, stomach issues and medication evaluation.  She reports past psychiatric history of anxiety and depression.  Medical history of chronic idiopathic facial pain for which she is being treated with neurology for. She is currently prescribed Ativan 1 mg four times a day as needed and fluoxetine 40 mg daily.  These are currently being prescribed by PCP and she has been on this current medication regimen for about 30 to 40 years.  No current outpatient psychiatric services. Teresa Hopkins, is seen face to face by this provider, consulted with Dr. Enedina Finner, and chart reviewed on 01/02/24.    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  I have been on several different medications to help with this pain but nothing consistently works.  Today when I woke up I felt very anxious and was tearful about having to deal with this pain.  The anxiety also makes my stomach hurt and cramp up.  I am not sure which medications are actually working and I just want to stop them all and restart on one medication at a time to see what helps me."  Patient reports that she is going on the Ativan 1 mg and fluoxetine 40 mg for about 30 years, which is also the last time she saw a psychiatrist who diagnosed her with anxiety and depression.  Husband provides a piece of paper stating that they do have a psychiatry appointment at [facility] on January 12, 2024 to  discuss medication management.  Patient was inquiring about stopping both the Ativan and the fluoxetine but was encouraged to continue taking these medications until she is seen by a psychiatrist that can monitor and provide medication management safely.  We also discussed the risks associated with abruptly discontinuing psychiatric medications.  She denies having any thoughts of death or suicidal ideation.  She also denies this history of suicide attempts or self injures behaviors.  Patient denies homicidal ideation and psychotic symptoms.   During evaluation Teresa Hopkins is sitting next to her husband, in no acute distress. She is alert & oriented x 4, calm, cooperative and attentive for this assessment. Her mood is anxious and depressed with congruent tearful affect.  She has normal speech, and behavior.  Objectively there is no evidence of psychosis/mania or delusional thinking. Pt does not appear to be responding to internal or external stimuli.  Patient is able to converse coherently, goal directed thoughts, no distractibility, or pre-occupation. She also denies suicidal/self-harm/homicidal ideation, psychosis, and paranoia.  Patient answered questions appropriately.    Flowsheet Row ED from 01/02/2024 in [facility]  C-SSRS RISK CATEGORY No Risk       Psychiatric Specialty Exam  Presentation  General Appearance:Appropriate for Environment  Eye Contact:Good  Speech:Clear and Coherent  Speech Volume:Normal  Handedness:Right   Mood and Affect  Mood: Depressed; Anxious  Affect: Tearful   Thought Process  Thought Processes: Coherent  Descriptions of Associations:Intact  Orientation:Full (Time, Place and Person)  Thought Content:WDL    Hallucinations:None  Ideas of Reference:None  Suicidal Thoughts:No  Homicidal Thoughts:No   Sensorium  Memory: Immediate Good; Recent Good  Judgment: Good  Insight: Good   Executive Functions   Concentration: Good  Attention Span: Good  Recall: Good  Fund of Knowledge: Good  Language: Good   Psychomotor Activity  Psychomotor Activity: Normal   Assets  Assets: Desire for Improvement; Housing; Physical Health; Social Support; Advertising copywriter; Manufacturing systems engineer; Intimacy; Resilience; Transportation   Sleep  Sleep: Fair  Number of hours:  6   Physical Exam: Physical Exam Vitals and nursing note reviewed.  HENT:     Head: Normocephalic.     Nose: Nose normal.  Eyes:     Extraocular Movements: Extraocular movements intact.  Cardiovascular:     Rate and Rhythm: Normal rate.  Pulmonary:     Effort: Pulmonary effort is normal.  Musculoskeletal:        General: Normal range of motion.     Cervical back: Normal range of motion.  Neurological:     General: No focal deficit present.     Mental Status: She is alert and oriented to person, place, and time.    Review of Systems  Constitutional: Negative.   HENT:         Chronic facial pain   Eyes: Negative.   Respiratory: Negative.    Cardiovascular: Negative.   Gastrointestinal:        Stomach cramping  Genitourinary: Negative.   Musculoskeletal: Negative.   Neurological: Negative.   Endo/Heme/Allergies: Negative.   Psychiatric/Behavioral:  Positive for depression. The patient is nervous/anxious.    Blood pressure 135/73, pulse 79, temperature 97.8 F (36.6 C), temperature source Oral, resp. rate 19, SpO2 99%. There is no height or weight on file to calculate BMI.  Musculoskeletal: Strength & Muscle Tone: within normal limits Gait & Station: normal Patient leans: N/A   [facility] MSE Discharge Disposition for Follow up and Recommendations: Based on my evaluation the patient does not appear to have an emergency medical condition and can be discharged with resources and follow up care in outpatient services for Medication Management and Individual Therapy  Patient is encouraged to  follow-up with her appointment at [facility] on March 10 at 10:00 AM for medication management and therapy services.  Safety planning was completed with patient and husband.  Husband does not have any concerns about patient being discharged home.  Patient denies any safety concerns and is able to contract for safety at this time.  Patient is educated on the risks associated with abruptly discontinuing psychiatric medications and is encouraged to continue those medications until she is seen by outpatient psychiatrist she is seen by an outpatient psychiatrist for monitoring.   Safety Plan Teresa Hopkins will reach out to her husband, call 911 or call mobile crisis, or go to nearest emergency room if condition worsens or if suicidal thoughts become active Patients' will follow up with [facility] for outpatient psychiatric services (therapy/medication management).  The suicide prevention education provided includes the following: Suicide risk factors Suicide prevention and interventions National Suicide Hotline telephone number [facility] assessment telephone number [facility] Emergency Assistance 911 [facility] and/or Residential Mobile Crisis Unit telephone number Request made of family/significant other to:   Remove weapons (e.g., guns, rifles, knives), all items previously/currently identified as safety concern.   Remove drugs/medications (over the counter, prescriptions, illicit drugs), all items previously/currently identified as a safety concern.  Howie Ill, NP 01/02/2024, 12:46 PM

## 2024-01-11 ENCOUNTER — Ambulatory Visit (HOSPITAL_COMMUNITY)
Admission: EM | Admit: 2024-01-11 | Discharge: 2024-01-11 | Disposition: A | Discharge status: Home / Self Care | Attending: Psychiatry | Admitting: Psychiatry

## 2024-01-11 DIAGNOSIS — R109 Unspecified abdominal pain: Secondary | ICD-10-CM | POA: Diagnosis present

## 2024-01-11 DIAGNOSIS — F419 Anxiety disorder, unspecified: Secondary | ICD-10-CM | POA: Diagnosis not present

## 2024-01-11 DIAGNOSIS — F411 Generalized anxiety disorder: Secondary | ICD-10-CM | POA: Insufficient documentation

## 2024-01-11 DIAGNOSIS — F430 Acute stress reaction: Secondary | ICD-10-CM | POA: Insufficient documentation

## 2024-01-11 MED ORDER — MAGNESIUM HYDROXIDE 400 MG/5ML PO SUSP: 30.0000 mL | Freq: Every day | ORAL | Status: DC | PRN

## 2024-01-11 MED ORDER — ALUM & MAG HYDROXIDE-SIMETH 200-200-20 MG/5ML PO SUSP: 30.0000 mL | ORAL | Status: DC | PRN

## 2024-01-11 MED ORDER — OLANZAPINE 5 MG PO TBDP: 5.0000 mg | ORAL_TABLET | Freq: Three times a day (TID) | ORAL | Status: DC | PRN

## 2024-01-11 MED ORDER — ACETAMINOPHEN 325 MG PO TABS: 650.0000 mg | ORAL_TABLET | Freq: Four times a day (QID) | ORAL | Status: DC | PRN

## 2024-01-11 MED ORDER — HYDROXYZINE HCL 25 MG PO TABS: 25.0000 mg | ORAL_TABLET | Freq: Three times a day (TID) | ORAL | Status: DC | PRN

## 2024-01-11 NOTE — ED Provider Notes (Signed)
 Behavioral Health Urgent Care Medical Screening Exam  Patient Name: Teresa Hopkins MRN: [id number] Date of Evaluation: 01/11/24 Chief Complaint:  "worsening anxiety for the past week."  Diagnosis:  Final diagnoses:  Acute anxiety    History of Present illness: Teresa Hopkins is a 73 y.o. female patient with a past psychiatric history significant generalized anxiety I and major depressive disorder and a medical history of for chronic idiopathic facial pain, and chronic lower abdominal pain who presented to the [facility] voluntary accompanied by her husband Teresa Hopkins with complaints of worsening anxiety.   Patient seen and evaluated face-to-face by this provider with her husband present, chart reviewed and case discussed with Dr. Enedina Finner.  On exam, patient is seated and is noted to be crying hysterically on approach. Patient states that she has been in pain all day and that her stomach has been hurting related to her anxiety. She describes her abdominal pain as lower medial abdominal pain that she describes as achy for the past couple months. She denies recent diarrhea and states that she has not had a bowel movement in the past week. She denies chest pains, or shortness of breath. Patient denied medical treatment at this time as she is adamant it is anxiety related and states, "I don't want to go to the hospital." Per chart review, patient has a history of chronic abdominal pain, unspecified  Patient describes her anxiety as stomach pain, nausea, heart beating fast, feel like she is losing her mind and cannot think straight and cannot seem to get out of pain. She reports taking Tylenol for the abdominal pain and Ativan for her anxiety. Her husband states that today she's taken a total Ativan  2 mgs. Patient is prescribed Ativan 1 mg four times a day as needed for anxiety. She is also prescribed fluoxetine 40 mg p.o. daily for depression and anxiety. Patient denies  current stressors or triggers attributing to her symptoms. She denies facial pain attributing to her symptoms and states that her face eels okay and that she is taking Vimpat for her facial pain. No reported depressive symptoms, reports fair sleep, on average she sleeps 6 to 7 hours per night, reports decreased appetite, on average she eats 1 meal per day. She denies drinking alcohol or using illicit drugs. Patient denies suicidal ideations. She denies past suicide attempts. She denies homicidal ideations. She denies auditory or visual hallucinations. There is no objective evidence that the patient is currently responding to internal or external stimuli.   The patient's husband states that the patient has had abdominal pain for the past couple years. He further states that the patient has been to the emergency department three times in the past 12 months and they have done general testing and imaging and found nothing wrong with her abdomen. He states that they were told at the patient's last emergency department visit that her abdominal pain was related to anxiety. He states that the patient had two tele-visits with a psychologist 6 months ago and had shown some improvement but she felt like she did not need it. He states that the patient has a long history of anxiety for the past 30 years and that her anxiety has worsened over the past 2 to 3 months. He states that for the past week the patient has been in the bed, in a fetal position, not wanting to get out of bed and not wanting to eat. He states that the patient has an appointment  tomorrow, 3/10 with [facility] at 10 AM but he feels that she may need more immediate help at this time due to her crying nonstop.   Plan of care: Treatment plan and case discussed with Dr. Enedina Finner. I discussed with the patient and the patient's husband recommendations for inpatient treatment to address acute anxiety.   The inpatient psychiatric treatment process was explained.  I spoke to Orlean Patten, Neurosurgeon to confirm bed ability at [facility] and beds are available. This information was communicated to the patient and the patient's husband to provide a sense of relief and comfort as the patient appears hesitant to consent for inpatient treatment and repeatedly states that she wants to go home. I allowed for the patient and the patient's husband to have privacy to discuss treatment options which included inpatient psychiatric treatment [facility]) versus outpatient treatment (scheduled appointment tomorrow, 3/10 at [facility]). Patient was agreeable to inpatient treatment. However, when the TTS counselor Teresa Hua went to assess the patient, the patient was hesitant to go inpatient.Teresa Hua and I went to speak with the patient to further discuss treatment options. Patient stated that she did not want to stay and did not want to go inpatient.  Patient stated that she will follow up with [facility] tomorrow for psychiatric treatment. Safety planning completed prior to discharge. The patient's husband confirms that there are no firearms in the home. Patient resides with her husband who is supportive. I discussed that if the patient's symptoms worsen, to bring the patient back to the behavioral health urgent care, nearest emergency department or call 911 for crisis assessment. I also discussed that if the patient's symptoms continue to worsen after her new patient appointment at [facility] tomorrow, that the patient may return for an assessment to determine if inpatient psychiatric treatment is required. Patient advised to seek medical attention if her abdominal pain worsens and she does not feel as if it is related to her anxiety.   Flowsheet Row ED from 01/11/2024 in [facility] ED from 01/02/2024 in [facility]  C-SSRS RISK CATEGORY No Risk No Risk       Psychiatric Specialty Exam  Presentation  General  Appearance:Casual  Eye Contact:Minimal  Speech:Clear and Coherent  Speech Volume:Decreased  Handedness:Right   Mood and Affect  Mood: Anxious  Affect: Tearful   Thought Process  Thought Processes: Coherent  Descriptions of Associations:Intact  Orientation:Full (Time, Place and Person)  Thought Content:WDL    Hallucinations:None  Ideas of Reference:None  Suicidal Thoughts:No  Homicidal Thoughts:No   Sensorium  Memory: Immediate Fair; Recent Fair; Remote Fair  Judgment: Intact  Insight: Present   Executive Functions  Concentration: Fair  Attention Span: Fair  Recall: [employer] of Knowledge: Fair  Language: Fair   Psychomotor Activity  Psychomotor Activity: Normal   Assets  Assets: Manufacturing systems engineer; Desire for Improvement; Financial Resources/Insurance; Housing; Leisure Time; Intimacy; Social Support   Sleep  Sleep: Fair  Number of hours:  7   Physical Exam: Physical Exam Cardiovascular:     Rate and Rhythm: Normal rate.  Pulmonary:     Effort: Pulmonary effort is normal.  Musculoskeletal:        General: Normal range of motion.  Neurological:     Mental Status: She is alert and oriented to person, place, and time.    Review of Systems  HENT: Negative.    Eyes: Negative.   Respiratory: Negative.    Cardiovascular: Negative.   Gastrointestinal:  Positive for abdominal pain and nausea.  Musculoskeletal: Negative.   Neurological: Negative.   Endo/Heme/Allergies: Negative.   Psychiatric/Behavioral:  The patient is nervous/anxious.    Blood pressure (!) 123/98, pulse 86, temperature 97.7 F (36.5 C), temperature source Oral, resp. rate 20, SpO2 100%. There is no height or weight on file to calculate BMI.  Musculoskeletal: Strength & Muscle Tone: within normal limits Gait & Station: normal Patient leans: N/A   [facility] MSE Discharge Disposition for Follow up and Recommendations: Based on my evaluation the  patient does not appear to have an emergency medical condition and can be discharged with resources and follow up care in outpatient services for Medication Management, Individual Therapy, and Group Therapy  Discharge recommendations:   Medications: Patient is to take medications as prescribed. No medication changes were made during your visit today. The patient or patient's guardian is to contact a medical professional and/or outpatient provider to address any new side effects that develop. The patient or the patient's guardian should update outpatient providers of any new medications and/or medication changes.   Outpatient Follow up: Please follow up with the [facility] on 01/12/24 for medication management and counseling services to address anxiety.   [facility] Address: [address], [place], [place] [address] Phone: [phone]  Therapy: We recommend that patient participate in individual therapy to address mental health concerns.  Safety:   The following safety precautions should be taken:   No sharp objects. This includes scissors, razors, scrapers, and putty knives.   Chemicals should be removed and locked up.   Medications should be removed and locked up.   Weapons should be removed and locked up. This includes firearms, knives and instruments that can be used to cause injury.   The patient should abstain from use of illicit substances/drugs and abuse of any medications.  If symptoms worsen or do not continue to improve or if the patient becomes actively suicidal or homicidal then it is recommended that the patient return to the closest hospital emergency department, the [facility], or call 911 for further evaluation and treatment. National Suicide Prevention Lifeline 1-800-SUICIDE or (320)888-4988.  About 988 988 offers 24/7 access to trained crisis counselors who can help  people experiencing mental health-related distress. People can call or text 988 or chat 988lifeline.org for themselves or if they are worried about a loved one who may need crisis support.     Teresa Barter, NP 01/11/2024, 6:04 PM

## 2024-01-11 NOTE — Discharge Instructions (Addendum)
 Discharge recommendations:   Medications: Patient is to take medications as prescribed. No medication changes were made during your visit today. The patient or patient's guardian is to contact a medical professional and/or outpatient provider to address any new side effects that develop. The patient or the patient's guardian should update outpatient providers of any new medications and/or medication changes.   Outpatient Follow up: Please follow up with the [facility] on 01/12/24 for medication management and counseling services to address anxiety.   [facility] Address: [address], [place], [place] [address] Phone: [phone]  Therapy: We recommend that patient participate in individual therapy to address mental health concerns.  Safety:   The following safety precautions should be taken:   No sharp objects. This includes scissors, razors, scrapers, and putty knives.   Chemicals should be removed and locked up.   Medications should be removed and locked up.   Weapons should be removed and locked up. This includes firearms, knives and instruments that can be used to cause injury.   The patient should abstain from use of illicit substances/drugs and abuse of any medications.  If symptoms worsen or do not continue to improve or if the patient becomes actively suicidal or homicidal then it is recommended that the patient return to the closest hospital emergency department, the [facility], or call 911 for further evaluation and treatment. National Suicide Prevention Lifeline 1-800-SUICIDE or 364-459-6664.  About 988 988 offers 24/7 access to trained crisis counselors who can help people experiencing mental health-related distress. People can call or text 988 or chat 988lifeline.org for themselves or if they are worried about a loved one who may need crisis support.

## 2024-01-11 NOTE — Progress Notes (Signed)
   01/11/24 1645  [facility] Triage Screening (Walk-ins at [facility] only)  How Did You Hear About [place]? Family/Friend  What Is the Reason for Your Visit/Call Today? Teresa Hopkins presents to [facility] voluntarily accompanied by her husband. Pt states that she has been in pain since early this morning and its now in her stomach. Per husband, pt has suffered with anxiety and depression for years and he believes that it is anxiety related. Pt states that she hasn't eaten much in the last 3-4 weeks. Pt currently denies SI, HI, AVH and alcohol/drug use. Per husband, the pt has a psych appt on tomorrow with [facility].  How Long Has This Been Causing You Problems? 1 wk - 1 month  Have You Recently Had Any Thoughts About Hurting Yourself? No  Are You Planning to Commit Suicide/Harm Yourself At This time? No  Have you Recently Had Thoughts About Hurting Someone [name]? No  Are You Planning To Harm Someone At This Time? No  Physical Abuse Denies  Verbal Abuse Denies  Sexual Abuse Denies  Exploitation of patient/patient's resources Denies  Self-Neglect Denies  Are you currently experiencing any auditory, visual or other hallucinations? No  Have You Used Any Alcohol or Drugs in the Past 24 Hours? No  Do you have any current medical co-morbidities that require immediate attention? No  Clinician description of patient physical appearance/behavior: whimpering in pain, anxious  What Do You Feel Would Help You the Most Today? Treatment for Depression or other mood problem  If access to [facility] was not available, would you have sought care in the Emergency Department? No  Determination of Need Routine (7 days)  Options For Referral Medication Management;Outpatient Therapy

## 2024-01-12 ENCOUNTER — Encounter: Payer: Self-pay | Admitting: Behavioral Health

## 2024-01-12 ENCOUNTER — Ambulatory Visit (INDEPENDENT_AMBULATORY_CARE_PROVIDER_SITE_OTHER): Payer: Medicare Other | Admitting: Behavioral Health

## 2024-01-12 VITALS — BP 134/77 | HR 78 | Ht 66.0 in | Wt 161.0 lb

## 2024-01-12 DIAGNOSIS — F411 Generalized anxiety disorder: Secondary | ICD-10-CM | POA: Diagnosis not present

## 2024-01-12 DIAGNOSIS — F331 Major depressive disorder, recurrent, moderate: Secondary | ICD-10-CM

## 2024-01-12 MED ORDER — DULOXETINE HCL 30 MG PO CPEP: 30.0000 mg | ORAL_CAPSULE | Freq: Two times a day (BID) | ORAL | 1 refills | Status: DC

## 2024-01-12 NOTE — Progress Notes (Signed)
 [facility] MD/PA/NP Initial Note  01/12/2024 5:17 PM Teresa Hopkins  MRN:  [id number]  Chief Complaint:  Chief Complaint   Anxiety; Depression; Establish Care; Medication Problem; Patient Education     HPI:  "Teresa Hopkins", 73 year old female presents to this office for initial visit and to establish care.  She is accompanied by her husband [name].  Collateral information should be considered reliable.  Patient states that she has struggled with anxiety and depression since 73 years of age.  Says that she has had 2 recent ER visits. One visit on 2/28, and also yesterday for severe paralyzing anxiety.  Says that it was recommended that she be admitted to [facility] geriatric psychiatry unit but she was fearful of rooming with another person.  She denies any current suicidal ideation.  Says that she has been prescribed Ativan for over 30 years and has also been on Prozac for 30 years plus.  Her husband says that anxiety and depression comes and goes but has been especially worse over the last month.  Says patient has not wanting to get out of bed or participate in activities.  Says that yesterday she was feeling so bad that she asked her husband to call 911.  Says that police recommended that she present to the [facility] UC.  Her husband decided to take her back home and follow-up with this appointment today.  Says that she cannot identify any known triggers for experiencing anxiety and depression.  Acknowledges that she has a good life and a good marriage.  Says that she normally has everything that she needs.  She does not understand why she continues to go through this.  She does report chronic pain in her abdomen for many years.  Says that she has been diagnostically cleared and they can never find anything wrong.  Says the pain is not present today and she may have several days per month where she feels no pain.  Says these are the days that she does the best.  She has followed up with her doctor and  various specialist.  Says that 1 doctor suggested that she holds her stress and anxiety in her gut.   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 Rate it 0/1 today.  She says the pain and discomfort causes cyclic anxiety and depression.  Says that she is here today for a psychiatric provider to review her medications and make new suggestions.  She endorses prior periods of increased self-confidence, racing thoughts, increased energy, and feeling active at times.  Her MDQ had 4 of the 14 criteria marked yes.  Not suggestive of a current mood disorder.  Her PHQ-9 score was 17.  Numerically rates her depression at 8/10, and anxiety at 7/10.  GAD score was 12.  She indicates that she noticed increased problems with anxiety post hysterectomy when she was in her early 81s.  She is a retired Engineer, site.  Has been married to her third husband for 25 years.  No biological children but good relationship with stepchildren and grandchildren.  Denies history of mania, reports no history of psychosis, no auditory or visual hallucinations or delirium.  Denies current SI or HI.  Not sure she would like to participate in psychotherapy at this time.   Patient states that she feels safe and verbally contracts for safety with this Clinical research associate.  Husband says that he assists with helping patient organize medications and provide strong support network.  Says that he spends most of  his time with her.  She is open today to making medication adjustments or changes as necessary.   Past psychiatric medication trials: Ativan Prozac Gabapentin Visit Diagnosis:    ICD-10-CM   1. Generalized anxiety disorder  F41.1 DULoxetine (CYMBALTA) 30 MG capsule    2. Major depressive disorder, recurrent episode, moderate (HCC)  F33.1 DULoxetine (CYMBALTA) 30 MG capsule      Past Psychiatric History: Anxiety, MDD  Past Medical History:  Past Medical History:  Diagnosis Date   Acid reflux    Anxiety    Chronic back  pain    Chronic facial pain    COVID-19    Depression    Gastritis    Hyperlipidemia    Hyperlipidemia    Trigeminal neuralgia     Past Surgical History:  Procedure Laterality Date   ABDOMINAL HYSTERECTOMY     BACK SURGERY     microdiskectomy - 03/2015   BUNIONECTOMY Right    CATARACT EXTRACTION     fallopian tube and ovary surgery     LUMBAR LAMINECTOMY/DECOMPRESSION MICRODISCECTOMY N/A 06/12/2015   Procedure: redo L45 microdiskectomy;  Surgeon: Coletta Memos, MD;  Location: [facility];  Service: Neurosurgery;  Laterality: N/A;  redo L45 microdiskectomy   TOTAL HIP ARTHROPLASTY Left 01/21/2020   Procedure: TOTAL HIP ARTHROPLASTY ANTERIOR APPROACH;  Surgeon: Jodi Geralds, MD;  Location: [facility] ORS;  Service: Orthopedics;  Laterality: Left;    Family Psychiatric History: see chart  Family History:  Family History  Problem Relation Age of Onset   Dementia Mother    Alzheimer's disease Mother    Heart attack Father     Social History:  Social History   Socioeconomic History   Marital status: Married    Spouse name: [name]   Number of children: 0   Years of education: 16   Highest education level: Bachelor's degree (e.g., BA, AB, BS)  Occupational History   Occupation: Retired Runner, broadcasting/film/video  Tobacco Use   Smoking status: Former    Current packs/day: 0.00    Types: Cigarettes    Quit date: 06/08/2000    Years since quitting: 23.6   Smokeless tobacco: Never  Vaping Use   Vaping status: Never Used  Substance and Sexual Activity   Alcohol use: Yes    Comment: occasional   Drug use: No   Sexual activity: Yes  Other Topics Concern   Not on file  Social History Narrative   Lives in [place] with husband.  Enjoys working [employer], reading, working puzzles.    Social Drivers of Corporate investment banker Strain: Not on file  Food Insecurity: Not on file  Transportation Needs: Not on file  Physical Activity: Not on file  Stress: Not on file  Social Connections: Unknown  (03/18/2022)   [facility]   Social Network    Social Network: Not on file    Allergies:  Allergies  Allergen Reactions   Lactose Intolerance (Gi) Other (See Comments)    Stomach aches    Metabolic Disorder Labs: No results found for: "HGBA1C", "MPG" No results found for: "PROLACTIN" No results found for: "CHOL", "TRIG", "HDL", "CHOLHDL", "VLDL", "LDLCALC" Lab Results  Component Value Date   TSH 2.14 12/18/2020    Therapeutic Level Labs: No results found for: "LITHIUM" No results found for: "VALPROATE" No results found for: "CBMZ"  Current Medications: Current Outpatient Medications  Medication Sig Dispense Refill   DULoxetine (CYMBALTA) 30 MG capsule Take 1 capsule (30 mg  total) by mouth 2 (two) times daily. 60 capsule 1   dicyclomine (BENTYL) 20 MG tablet Take 20 mg by mouth 4 (four) times daily -  before meals and at bedtime.     FLUoxetine (PROZAC) 40 MG capsule Take 40 mg by mouth daily.      lacosamide (VIMPAT) 50 MG TABS tablet Take 1 tablet (50 mg total) by mouth 2 (two) times daily. 60 tablet 5   LORazepam (ATIVAN) 1 MG tablet Take 1 mg by mouth 4 (four) times daily as needed (anxiety).      traMADol (ULTRAM) 50 MG tablet Take 1 tablet (50 mg total) by mouth every 12 (twelve) hours as needed. 60 tablet 1   tretinoin (RETIN-A) 0.1 % cream Apply 1 application topically at bedtime.      No current facility-administered medications for this visit.    Medication Side Effects: none  Orders placed this visit:  No orders of the defined types were placed in this encounter.   Psychiatric Specialty Exam:  Review of Systems  Blood pressure 134/77, pulse 78, height 5\' 6"  (1.676 m), weight 161 lb (73 kg).Body mass index is 25.99 kg/m.  General Appearance: Casual, Neat, and Well Groomed  Eye Contact:  Good  Speech:  Clear and Coherent  Volume:  Normal  Mood:  Anxious, Depressed, and Dysphoric  Affect:  Congruent, Depressed, Flat, and  Anxious  Thought Process:  Coherent  Orientation:  Full (Time, Place, and Person)  Thought Content: Logical   Suicidal Thoughts:  No  Homicidal Thoughts:  No  Memory:  WNL  Judgement:  Good  Insight:  Good  Psychomotor Activity:  Normal  Concentration:  Concentration: Good  Recall:  Good  Fund of Knowledge: Good  Language: Good  Assets:  Desire for Improvement  ADL's:  Intact  Cognition: WNL  Prognosis:  Good   Screenings:  GAD-7    Flowsheet Row Office Visit from 01/12/2024 in [facility]  Total GAD-7 Score 12      PHQ2-9    Flowsheet Row Office Visit from 01/12/2024 in [facility]  PHQ-2 Total Score 5  PHQ-9 Total Score 17      Flowsheet Row ED from 01/11/2024 in [facility] ED from 01/02/2024 in [facility]  C-SSRS RISK CATEGORY No Risk No Risk       Receiving Psychotherapy: No   Treatment Plan/Recommendations:   Greater than 50% of 60 min face to face time with patient was spent on counseling and coordination of care. We discussed her long-term problems with anxiety and depression stemming back to her early 30s.  Problems seem to get worse after having hysterectomy in her early 74s.  We discussed her problems with chronic pain in face and epigastric area that tends to be cyclic.  This increases stress, anxiety, and depressive episodes.  We reviewed her medications and talked about other medication options that might be appropriate.  Reviewed side effect profiles of medications presented.  She and her husband expressed understanding of how to cross titrate medication.  We also discussed her extremely long-term use of benzodiazepines.  She is agreeable to eventually try to wean down to more appropriate level when stabilized.  Patient expresses understanding and concern.  At this time, I do not feel it is appropriate or safe to start the weaning process now.  We  will attempt to administer a new antidepressant that may help reduce symptoms before attempting weaning  process.  Patient reports no history of falls and no deficits with ADLs.  We agreed today to: Will reduce Prozac to 20 mg for 7 days, then every other day for 7 days, then stop medication. Will start Cymbalta 30 mg for 7 days, then increase to 60 mg daily.  Take 30 mg capsule in the morning and 30 mg capsule in the evening. Will report side effects or worsening symptoms promptly Will follow-up in 4 weeks to reassess Provided emergency contact information such as 911, nearest ER, [facility], after-hours number. Provided information to [employer] as additional resource for residential therapy.  Greater than 50% of face to face time with patient was spent on counseling and coordination of care.  Discussed potential benefits, risk, and side effects of benzodiazepines to include potential risk of tolerance and dependence, as well as possible drowsiness.  Advised patient not to drive if experiencing drowsiness and to take lowest possible effective dose to minimize risk of dependence and tolerance.  Reviewed PDMP   Joan Flores, NP

## 2024-01-18 DIAGNOSIS — Z9889 Other specified postprocedural states: Secondary | ICD-10-CM | POA: Diagnosis not present

## 2024-01-18 DIAGNOSIS — Z90721 Acquired absence of ovaries, unilateral: Secondary | ICD-10-CM | POA: Diagnosis not present

## 2024-01-18 DIAGNOSIS — R109 Unspecified abdominal pain: Secondary | ICD-10-CM | POA: Diagnosis not present

## 2024-01-18 DIAGNOSIS — R519 Headache, unspecified: Secondary | ICD-10-CM | POA: Diagnosis not present

## 2024-01-18 DIAGNOSIS — K5900 Constipation, unspecified: Secondary | ICD-10-CM | POA: Diagnosis not present

## 2024-01-18 DIAGNOSIS — Z90710 Acquired absence of both cervix and uterus: Secondary | ICD-10-CM | POA: Diagnosis not present

## 2024-01-22 DIAGNOSIS — K5900 Constipation, unspecified: Secondary | ICD-10-CM | POA: Diagnosis not present

## 2024-01-22 DIAGNOSIS — R1084 Generalized abdominal pain: Secondary | ICD-10-CM | POA: Diagnosis not present

## 2024-01-26 ENCOUNTER — Telehealth: Payer: Self-pay | Admitting: Behavioral Health

## 2024-01-26 NOTE — Telephone Encounter (Signed)
 Pt called and LM around 4:20 today. She explained that Teresa Hopkins is helping her reduce some of her medications. Her neurologist has prescribed lucosamide (sp?) for facial nerve pain and she would like your opinion on whether she can take this med with the others she is on.

## 2024-01-27 NOTE — Telephone Encounter (Signed)
 After discussing with the patient she realized she should have called Dr. Jetty Duhamel office.

## 2024-01-28 ENCOUNTER — Telehealth: Payer: Self-pay | Admitting: Neurology

## 2024-01-28 NOTE — Telephone Encounter (Signed)
 Pt would like to talk with Dr Epimenio Foot about coming off of lacosamide (VIMPAT) 50 MG TABS tablet as quickly as possible. Please call.

## 2024-01-28 NOTE — Telephone Encounter (Signed)
 LVM for pt to call office.  Per Dr. Epimenio Foot, she can take 1 tablet daily for one week and then stop.

## 2024-01-29 ENCOUNTER — Encounter: Payer: Self-pay | Admitting: Neurology

## 2024-01-29 NOTE — Telephone Encounter (Signed)
 Pt send mychart today, replied to [employer].

## 2024-01-29 NOTE — Telephone Encounter (Signed)
 Tried calling ptx2. Went straight to VM. LVM for pt to call.

## 2024-02-01 ENCOUNTER — Other Ambulatory Visit: Payer: Self-pay | Admitting: Neurology

## 2024-02-01 MED ORDER — LAMOTRIGINE 25 MG PO TABS: ORAL_TABLET | ORAL | 5 refills | Status: DC

## 2024-02-03 DIAGNOSIS — R109 Unspecified abdominal pain: Secondary | ICD-10-CM | POA: Diagnosis not present

## 2024-02-09 ENCOUNTER — Ambulatory Visit: Admitting: Behavioral Health

## 2024-02-12 ENCOUNTER — Other Ambulatory Visit: Payer: Self-pay | Admitting: Behavioral Health

## 2024-02-12 DIAGNOSIS — F411 Generalized anxiety disorder: Secondary | ICD-10-CM

## 2024-02-12 DIAGNOSIS — F331 Major depressive disorder, recurrent, moderate: Secondary | ICD-10-CM

## 2024-02-27 ENCOUNTER — Ambulatory Visit: Admitting: Behavioral Health

## 2024-02-27 ENCOUNTER — Encounter: Payer: Self-pay | Admitting: Behavioral Health

## 2024-02-27 DIAGNOSIS — F411 Generalized anxiety disorder: Secondary | ICD-10-CM

## 2024-02-27 DIAGNOSIS — F331 Major depressive disorder, recurrent, moderate: Secondary | ICD-10-CM | POA: Diagnosis not present

## 2024-02-27 MED ORDER — DULOXETINE HCL 30 MG PO CPEP: 30.0000 mg | ORAL_CAPSULE | Freq: Two times a day (BID) | ORAL | 2 refills | Status: DC

## 2024-02-27 MED ORDER — LORAZEPAM 1 MG PO TABS: 1.0000 mg | ORAL_TABLET | Freq: Three times a day (TID) | ORAL | 1 refills | Status: DC | PRN

## 2024-02-27 MED ORDER — DULOXETINE HCL 30 MG PO CPEP
30.0000 mg | ORAL_CAPSULE | Freq: Two times a day (BID) | ORAL | 2 refills | Status: DC
Start: 2024-02-27 — End: 2024-05-23

## 2024-02-27 NOTE — Progress Notes (Signed)
 [facility] Med Check  Patient ID: Teresa Hopkins,  MRN: [id number]  PCP: Lorenza Romans, FNP  Date of Evaluation: 02/27/2024 Time spent:30 minutes  Chief Complaint:  Chief Complaint   Depression; Anxiety; Follow-up; Medication Refill; Patient Education     HISTORY/CURRENT STATUS: HPI "Teresa Hopkins", 73 year old female presents to this office for follow up and medication management. Says that she is "doing relatively well". Says her gut has calmed with medication and her anxiety and depression levels have improved since last visit. Believes that Cymbalta  is starting to work but only been on medication 2-3 weeks so far. She understands that it may take medication 4-6 weeks or longer to fully work.   Numerically rates her depression at 3/10, and anxiety at 3/10. She is requesting to stay the course and not adjust medications this visit.   Denies history of mania, reports no history of psychosis, no auditory or visual hallucinations or delirium.  Denies current SI or HI.     Past psychiatric medication trials: Ativan  Prozac  Gabapentin  Individual Medical History/ Review of Systems: Changes? :No   Allergies: Lactose intolerance (gi)  Current Medications:  Current Outpatient Medications:    dicyclomine (BENTYL) 20 MG tablet, Take 20 mg by mouth 4 (four) times daily -  before meals and at bedtime., Disp: , Rfl:    DULoxetine  (CYMBALTA ) 30 MG capsule, Take 1 capsule (30 mg total) by mouth 2 (two) times daily., Disp: 60 capsule, Rfl: 2   FLUoxetine  (PROZAC ) 40 MG capsule, Take 40 mg by mouth daily. , Disp: , Rfl:    lamoTRIgine  (LAMICTAL ) 25 MG tablet, Take 1 pill once a day for 7 days, then take 1 pill twice a day for 7 days, then go up to 2 pills twice a day, Disp: 120 tablet, Rfl: 5   LORazepam  (ATIVAN ) 1 MG tablet, Take 1 tablet (1 mg total) by mouth every 8 (eight) hours as needed (anxiety)., Disp: 90 tablet, Rfl: 1   traMADol  (ULTRAM ) 50 MG tablet, Take 1 tablet (50 mg total) by  mouth every 12 (twelve) hours as needed., Disp: 60 tablet, Rfl: 1   tretinoin (RETIN-A) 0.1 % cream, Apply 1 application topically at bedtime. , Disp: , Rfl:  Medication Side Effects: none  Family Medical/ Social History: Changes? No  MENTAL HEALTH EXAM:  There were no vitals taken for this visit.There is no height or weight on file to calculate BMI.  General Appearance: Casual, Neat, and Well Groomed  Eye Contact:  Good  Speech:  Clear and Coherent  Volume:  Normal  Mood:  NA  Affect:  Appropriate  Thought Process:  Coherent  Orientation:  Full (Time, Place, and Person)  Thought Content: Logical   Suicidal Thoughts:  No  Homicidal Thoughts:  No  Memory:  WNL  Judgement:  Good  Insight:  Good  Psychomotor Activity:  Normal  Concentration:  Concentration: Good  Recall:  Good  Fund of Knowledge: Good  Language: Good  Assets:  Desire for Improvement  ADL's:  Intact  Cognition: WNL  Prognosis:  Good    DIAGNOSES:    ICD-10-CM   1. Generalized anxiety disorder  F41.1 LORazepam  (ATIVAN ) 1 MG tablet    DULoxetine  (CYMBALTA ) 30 MG capsule    DISCONTINUED: DULoxetine  (CYMBALTA ) 30 MG capsule    2. Major depressive disorder, recurrent episode, moderate (HCC)  F33.1 DULoxetine  (CYMBALTA ) 30 MG capsule    DISCONTINUED: DULoxetine  (CYMBALTA ) 30 MG capsule      Receiving Psychotherapy: No    RECOMMENDATIONS:  Greater than 50% of 30 min face to face time with patient was spent on counseling and coordination of care.  We discussed her moderate improvement with anxiety and depression. She is happy with the progress so far. Stomach pain has been more controlled recently. Understands that she has not been on Cymbalta  long enough for medication to fully work. She is agreeable to eventually try to wean down to more appropriate level when stabilized.  Patient expresses understanding and concern.  At this time, I do not feel it is appropriate or safe to start the weaning process now.    We  agreed today to: To continue Cymbalta   60 mg daily.  Take 30 mg capsule in the morning and 30 mg capsule in the evening. Continue Ativan  1 mg three times daily as needed for severe anxiety.  Will report side effects or worsening symptoms promptly Will follow-up in 4 weeks to reassess Provided emergency contact information such as 911, nearest ER, [facility], after-hours number. Provided information to [employer] as additional resource for residential therapy.  Greater than 50% of face to face time with patient was spent on counseling and coordination of care.  Discussed potential benefits, risk, and side effects of benzodiazepines to include potential risk of tolerance and dependence, as well as possible drowsiness.  Advised patient not to drive if experiencing drowsiness and to take lowest possible effective dose to minimize risk of dependence and tolerance.  Reviewed PDMP  Lincoln Renshaw, NP

## 2024-02-28 ENCOUNTER — Encounter: Payer: Self-pay | Admitting: Neurology

## 2024-03-04 ENCOUNTER — Other Ambulatory Visit: Payer: Self-pay | Admitting: *Deleted

## 2024-03-04 DIAGNOSIS — R519 Headache, unspecified: Secondary | ICD-10-CM

## 2024-03-04 DIAGNOSIS — G509 Disorder of trigeminal nerve, unspecified: Secondary | ICD-10-CM

## 2024-03-04 DIAGNOSIS — R208 Other disturbances of skin sensation: Secondary | ICD-10-CM

## 2024-03-04 DIAGNOSIS — G8929 Other chronic pain: Secondary | ICD-10-CM

## 2024-03-04 DIAGNOSIS — J3489 Other specified disorders of nose and nasal sinuses: Secondary | ICD-10-CM

## 2024-03-08 ENCOUNTER — Telehealth: Payer: Self-pay | Admitting: Neurology

## 2024-03-08 NOTE — Telephone Encounter (Signed)
 Pain clinic referral faxed to [facility] (fax# [phone], phone# [phone])

## 2024-03-16 DIAGNOSIS — M1711 Unilateral primary osteoarthritis, right knee: Secondary | ICD-10-CM | POA: Diagnosis not present

## 2024-03-16 DIAGNOSIS — M170 Bilateral primary osteoarthritis of knee: Secondary | ICD-10-CM | POA: Diagnosis not present

## 2024-03-16 DIAGNOSIS — M79671 Pain in right foot: Secondary | ICD-10-CM | POA: Diagnosis not present

## 2024-03-16 DIAGNOSIS — M1712 Unilateral primary osteoarthritis, left knee: Secondary | ICD-10-CM | POA: Diagnosis not present

## 2024-04-09 ENCOUNTER — Ambulatory Visit: Admitting: Behavioral Health

## 2024-04-14 ENCOUNTER — Ambulatory Visit: Payer: Medicare Other | Admitting: Family Medicine

## 2024-05-04 ENCOUNTER — Other Ambulatory Visit: Payer: Self-pay | Admitting: Neurology

## 2024-05-04 ENCOUNTER — Other Ambulatory Visit: Payer: Self-pay | Admitting: Behavioral Health

## 2024-05-04 DIAGNOSIS — F411 Generalized anxiety disorder: Secondary | ICD-10-CM

## 2024-05-05 NOTE — Telephone Encounter (Signed)
 Last seen on 10/16/23 No follow up scheduled   Dispensed Days Supply Quantity Provider Pharmacy  tramadol  50 mg tablet 11/28/2023 30 60 each Sater, Charlie LABOR, MD [facility]     Rx pending to be

## 2024-05-21 ENCOUNTER — Other Ambulatory Visit: Payer: Self-pay | Admitting: Behavioral Health

## 2024-05-21 DIAGNOSIS — F331 Major depressive disorder, recurrent, moderate: Secondary | ICD-10-CM

## 2024-05-21 DIAGNOSIS — F411 Generalized anxiety disorder: Secondary | ICD-10-CM

## 2024-05-25 ENCOUNTER — Telehealth: Payer: Self-pay | Admitting: Neurology

## 2024-05-25 NOTE — Telephone Encounter (Signed)
 Left message for patient to call ( see patient advise message on 02/28/24.)

## 2024-05-25 NOTE — Telephone Encounter (Signed)
 Pt called wanting to know if the provider will re prescribe the OXcarbazepine  (TRILEPTAL ) 150 MG tablet to the pt and send in for a refill. Please advise.

## 2024-05-26 ENCOUNTER — Ambulatory Visit (INDEPENDENT_AMBULATORY_CARE_PROVIDER_SITE_OTHER): Admitting: Behavioral Health

## 2024-05-26 ENCOUNTER — Encounter: Payer: Self-pay | Admitting: Behavioral Health

## 2024-05-26 DIAGNOSIS — F411 Generalized anxiety disorder: Secondary | ICD-10-CM

## 2024-05-26 DIAGNOSIS — F331 Major depressive disorder, recurrent, moderate: Secondary | ICD-10-CM

## 2024-05-26 MED ORDER — OXCARBAZEPINE 150 MG PO TABS: 150.0000 mg | ORAL_TABLET | Freq: Three times a day (TID) | ORAL | 11 refills | Status: AC

## 2024-05-26 MED ORDER — OXCARBAZEPINE 150 MG PO TABS: 150.0000 mg | ORAL_TABLET | Freq: Two times a day (BID) | ORAL | 11 refills | Status: DC

## 2024-05-26 MED ORDER — DULOXETINE HCL 30 MG PO CPEP: 30.0000 mg | ORAL_CAPSULE | Freq: Two times a day (BID) | ORAL | 3 refills | Status: DC

## 2024-05-26 MED ORDER — LORAZEPAM 1 MG PO TABS: 1.0000 mg | ORAL_TABLET | Freq: Three times a day (TID) | ORAL | 3 refills | Status: DC | PRN

## 2024-05-26 NOTE — Progress Notes (Signed)
 [facility]  Patient ID: Teresa Hopkins,  MRN: [id number]  PCP: Crecencio Chiquita POUR, FNP  Date of Evaluation: 05/26/2024 Time spent:30 minutes  Chief Complaint:  Chief Complaint   Depression; Anxiety; Follow-up; Medication Refill; Patient Education     HISTORY/CURRENT STATUS: HPI Teresa Hopkins, 73 year old female presents to this office for follow up and medication management. Says that she is feeling really good right now. She and husband have been busy traveling.  Says her gut has calmed with medication and her anxiety and depression levels have improved since last visit. Numerically rates her depression at 2/10, and anxiety at 2/10. She is requesting to stay the course and not adjust medications this visit.   Denies history of mania, reports no history of psychosis, no auditory or visual hallucinations or delirium.  Denies current SI or HI.     Past psychiatric medication trials: Ativan  Prozac  Gabapentin  Individual Medical History/ Review of Systems: Changes? :No   Allergies: Lactose intolerance (gi)  Current Medications:  Current Outpatient Medications:    dicyclomine (BENTYL) 20 MG tablet, Take 20 mg by mouth 4 (four) times daily -  before meals and at bedtime., Disp: , Rfl:    DULoxetine  (CYMBALTA ) 30 MG capsule, Take 1 capsule (30 mg total) by mouth 2 (two) times daily., Disp: 60 capsule, Rfl: 3   FLUoxetine  (PROZAC ) 40 MG capsule, Take 40 mg by mouth daily. , Disp: , Rfl:    lamoTRIgine  (LAMICTAL ) 25 MG tablet, Take 1 pill once a day for 7 days, then take 1 pill twice a day for 7 days, then go up to 2 pills twice a day, Disp: 120 tablet, Rfl: 5   LORazepam  (ATIVAN ) 1 MG tablet, Take 1 tablet (1 mg total) by mouth every 8 (eight) hours as needed (anxiety)., Disp: 90 tablet, Rfl: 3   traMADol  (ULTRAM ) 50 MG tablet, Take 1 tablet (50 mg total) by mouth every 12 (twelve) hours as needed., Disp: 60 tablet, Rfl: 3   tretinoin (RETIN-A) 0.1 % cream, Apply 1 application  topically at bedtime. , Disp: , Rfl:  Medication Side Effects: none  Family Medical/ Social History: Changes? No  MENTAL HEALTH EXAM:  Height 5' 6 (1.676 m), weight 160 lb (72.6 kg).Body mass index is 25.82 kg/m.  General Appearance: Casual, Neat, and Well Groomed  Eye Contact:  Good  Speech:  Clear and Coherent  Volume:  Normal  Mood:  NA  Affect:  Appropriate  Thought Process:  Coherent  Orientation:  Full (Time, Place, and Person)  Thought Content: Logical   Suicidal Thoughts:  No  Homicidal Thoughts:  No  Memory:  WNL  Judgement:  Good  Insight:  Good  Psychomotor Activity:  Normal  Concentration:  Concentration: Good  Recall:  Good  Fund of Knowledge: Good  Language: Good  Assets:  Desire for Improvement  ADL's:  Intact  Cognition: WNL  Prognosis:  Good    DIAGNOSES:    ICD-10-CM   1. Generalized anxiety disorder  F41.1 LORazepam  (ATIVAN ) 1 MG tablet    DULoxetine  (CYMBALTA ) 30 MG capsule    2. Major depressive disorder, recurrent episode, moderate (HCC)  F33.1 DULoxetine  (CYMBALTA ) 30 MG capsule      Receiving Psychotherapy: No    RECOMMENDATIONS:   Greater than 50% of 30 min face to face time with patient was spent on counseling and coordination of care.  We discussed her significant improvement with anxiety and depression. She is happy with the progress so far. Smiling today and  very positive. Stomach pain has been more controlled recently.    We agreed today to: To continue Cymbalta   60 mg daily.  Take 30 mg capsule in the morning and 30 mg capsule in the evening. Continue Ativan  1 mg three times daily as needed for severe anxiety.  Will report side effects or worsening symptoms promptly Will follow-up in 3 months to reassess Greater than 50% of face to face time with patient was spent on counseling and coordination of care.  Discussed potential benefits, risk, and side effects of benzodiazepines to include potential risk of tolerance and dependence,  as well as possible drowsiness.  Advised patient not to drive if experiencing drowsiness and to take lowest possible effective dose to minimize risk of dependence and tolerance.  Reviewed PDMP                        Redell DELENA Pizza, NP

## 2024-05-26 NOTE — Addendum Note (Signed)
 Addended by: DOUGLASS DELON CROME on: 05/26/2024 04:11 PM   Modules accepted: Orders

## 2024-05-26 NOTE — Telephone Encounter (Addendum)
 Dr.Sater I called and spoke with patient states she has been doing well on oxcarbazepine  150 mg 1 po BID. She is requesting a refill. Pt said in last couple of week she noticed when waking up she has head congestion, no drainage, she has noticed when taking naps as well, no fevers, no nausea/vomiting.   She asked if an increase would be an option for oxcarbazepine  150 mg from BID to TID? To see if that would help with symptoms?  She is scheduled for follow up visit 07/13/24.  Please advise

## 2024-05-26 NOTE — Addendum Note (Signed)
 Addended by: VEAR CHARLIE LABOR on: 05/26/2024 08:50 PM   Modules accepted: Orders

## 2024-05-26 NOTE — Telephone Encounter (Signed)
 Dr. Vear- see question: She asked if an increase would be an option for oxcarbazepine  150 mg from BID to TID

## 2024-05-27 ENCOUNTER — Encounter: Payer: Self-pay | Admitting: Neurology

## 2024-05-27 NOTE — Telephone Encounter (Signed)
 Patient informed new Rx has been sent to pharmacy.

## 2024-07-13 ENCOUNTER — Ambulatory Visit (INDEPENDENT_AMBULATORY_CARE_PROVIDER_SITE_OTHER): Admitting: Neurology

## 2024-07-13 ENCOUNTER — Encounter: Payer: Self-pay | Admitting: Neurology

## 2024-07-13 VITALS — BP 108/60 | HR 86 | Ht 66.0 in | Wt 164.6 lb

## 2024-07-13 DIAGNOSIS — G8929 Other chronic pain: Secondary | ICD-10-CM

## 2024-07-13 DIAGNOSIS — J3489 Other specified disorders of nose and nasal sinuses: Secondary | ICD-10-CM | POA: Diagnosis not present

## 2024-07-13 DIAGNOSIS — R519 Headache, unspecified: Secondary | ICD-10-CM

## 2024-07-13 DIAGNOSIS — R208 Other disturbances of skin sensation: Secondary | ICD-10-CM

## 2024-07-13 NOTE — Progress Notes (Signed)
 [facility]  PATIENT: Teresa Hopkins DOB: [birth year]  REFERRING DOCTOR OR PCP:  Chiquita Maize FNP SOURCE: patient, notes from Dr. Rush and Amy Lomax, imaging and lab reports, MRI images personally reviewed  _________________________________   HISTORICAL  CHIEF COMPLAINT:  Chief Complaint  Patient presents with   Follow-up    RM 10, alone.    HISTORY OF PRESENT ILLNESS:  Ms. Teresa Hopkins is a 73 y.o. woman with atypical facial pain  Update 07/13/2024 She continues to have bilateral facial pain - it is sporadic and she feels the pain most near the nose.   She is currently on duloxetine  30 mg po daily and oxcarbazepine  150 mg tid with mild benefit and she tolerates these doses.  Current pain is manageable  Gabapentin  was stopped hen it stopped  working.  Lamotrigine  had not helped.     Imaging when symptoms started in 2023 did not show significant sinusitis and no involvement of trigeminal nerves or their dorsal root entry zones.    She also has anxiety and is on fluoxetine  and lorazepam .     HISTORY (copied from Dr Merna previous note and Amy Lomax's note)  The patient presents for evaluation of facial pain which began 3 months ago. About 3 months ago she had a tooth implant in her right upper gum. Otherwise nothing out of the ordinary occurred around that time. No recent illnesses or head trauma.    She describes the pain as constant burning pain in her nostrils. Predominantly on the right side but can be on the left as well. Sometimes pain will become so bad that she will develop a frontal headache. This is associated with phonophobia and osmophobia, no photophobia or nausea. No vision changes. She has tried saline flushes which irritated her sinuses. Takes hydrocodone  as needed which relieves the pain for 3-4 hours, but then pain returns. Advil and ibuprofen do not help.   Saw ENT who ordered CT sinus 03/26/22 showed a right nasal septal deviation and left  bony spur but no sign of active sinusitis.   Recent eye exam was normal.   FACIAL PAIN FEATURES  Side: right and left Distribution: nostrils Any pain on side or back of head: yes Character: burning Duration: constant Pain-free between episodes: no Triggers: strong smells, touching face Sensory abnormalities: no Tried Tegretol/Trileptal : no Prior procedures/outcome: no History of MS, Lyme's disease, facial rash: no History of dental/oral surgery, facial/plastic surgery: dental implant on right   Current Treatment: Abortive hydrocodone    Preventative none   Prior Therapies                                 Fluoxetine  40 mg daily Tylenol  Hydrocodone   REVIEW OF SYSTEMS: Constitutional: No fevers, chills, sweats, or change in appetite Eyes: No visual changes, double vision, eye pain Ear, nose and throat: No hearing loss, ear pain, nasal congestion, sore throat Cardiovascular: No chest pain, palpitations Respiratory:  No shortness of breath at rest or with exertion.   No wheezes GastrointestinaI: No nausea, vomiting, diarrhea, abdominal pain, fecal incontinence Genitourinary:  No dysuria, urinary retention or frequency.  No nocturia. Musculoskeletal:  No neck pain, back pain Integumentary: No rash, pruritus, skin lesions Neurological: as above Psychiatric: No depression at this time.  No anxiety Endocrine: No palpitations, diaphoresis, change in appetite, change in weigh or increased thirst Hematologic/Lymphatic:  No anemia, purpura, petechiae. Allergic/Immunologic: No itchy/runny eyes, nasal congestion, recent  allergic reactions, rashes  ALLERGIES: Allergies  Allergen Reactions   Lactose Intolerance (Gi) Other (See Comments)    Stomach aches    HOME MEDICATIONS:  Current Outpatient Medications:    DULoxetine  (CYMBALTA ) 30 MG capsule, Take 1 capsule (30 mg total) by mouth 2 (two) times daily., Disp: 60 capsule, Rfl: 3   LORazepam  (ATIVAN ) 1 MG tablet, Take 1 tablet (1  mg total) by mouth every 8 (eight) hours as needed (anxiety)., Disp: 90 tablet, Rfl: 3   OXcarbazepine  (TRILEPTAL ) 150 MG tablet, Take 1 tablet (150 mg total) by mouth in the morning, at noon, and at bedtime., Disp: 90 tablet, Rfl: 11  PAST MEDICAL HISTORY: Past Medical History:  Diagnosis Date   Acid reflux    Anxiety    Chronic back pain    Chronic facial pain    COVID-19    Depression    Gastritis    Hyperlipidemia    Hyperlipidemia    Trigeminal neuralgia     PAST SURGICAL HISTORY: Past Surgical History:  Procedure Laterality Date   ABDOMINAL HYSTERECTOMY     BACK SURGERY     microdiskectomy - 03/2015   BUNIONECTOMY Right    CATARACT EXTRACTION     fallopian tube and ovary surgery     LUMBAR LAMINECTOMY/DECOMPRESSION MICRODISCECTOMY N/A 06/12/2015   Procedure: redo L45 microdiskectomy;  Surgeon: Rockey Peru, MD;  Location: [facility];  Service: Neurosurgery;  Laterality: N/A;  redo L45 microdiskectomy   TOTAL HIP ARTHROPLASTY Left 01/21/2020   Procedure: TOTAL HIP ARTHROPLASTY ANTERIOR APPROACH;  Surgeon: Yvone Rush, MD;  Location: [facility];  Service: Orthopedics;  Laterality: Left;    FAMILY HISTORY: Family History  Problem Relation Age of Onset   Dementia Mother    Alzheimer's disease Mother    Heart attack Father     SOCIAL HISTORY: Social History   Socioeconomic History   Marital status: Married    Spouse name: Medford   Number of children: 0   Years of education: 16   Highest education level: Bachelor's degree (e.g., BA, AB, BS)  Occupational History   Occupation: Retired Runner, broadcasting/film/video  Tobacco Use   Smoking status: Former    Current packs/day: 0.00    Types: Cigarettes    Quit date: 06/08/2000    Years since quitting: 24.1   Smokeless tobacco: Never  Vaping Use   Vaping status: Never Used  Substance and Sexual Activity   Alcohol use: Yes    Comment: occasional   Drug use: No   Sexual activity: Yes  Other Topics Concern   Not on file  Social  History Narrative   Lives in [place] with husband.  Enjoys working [employer], reading, working puzzles.    Social Drivers of Corporate investment banker Strain: Low Risk  (01/22/2024)   Received from [facility]   Overall Financial Resource Strain (CARDIA)    Difficulty of Paying Living Expenses: Not hard at all  Food Insecurity: No Food Insecurity (01/22/2024)   Received from [facility]   Hunger Vital Sign    Within the past 12 months, you worried that your food would run out before you got the money to buy more.: Never true    Within the past 12 months, the food you bought just didn't last and you didn't have money to get more.: Never true  Transportation Needs: No Transportation Needs (01/22/2024)   Received from [facility] - Transportation    Lack of Transportation (Medical):  No    Lack of Transportation (Non-Medical): No  Physical Activity: Insufficiently Active (01/22/2024)   Received from [facility]   Exercise Vital Sign    On average, how many days per week do you engage in moderate to strenuous exercise (like a brisk walk)?: 2 days    On average, how many minutes do you engage in exercise at this level?: 10 min  Stress: Patient Declined (01/22/2024)   Received from [facility] - Occupational Stress Questionnaire    Feeling of Stress : Patient declined  Social Connections: Moderately Integrated (01/22/2024)   Received from [facility]   Social Network    How would you rate your social network (family, work, friends)?: Adequate participation with social networks  Intimate Partner Violence: Not At Risk (01/22/2024)   Received from [facility]   HITS    Over the last 12 months how often did your partner physically hurt you?: Never    Over the last 12 months how often did your partner insult you or talk down to you?: Never    Over the last 12 months how often did your partner threaten you with physical harm?: Never     Over the last 12 months how often did your partner scream or curse at you?: Never       PHYSICAL EXAM  Vitals:   07/13/24 1458  BP: 108/60  Pulse: 86  SpO2: 99%  Weight: 164 lb 9.6 oz (74.7 kg)  Height: 5' 6 (1.676 m)    Body mass index is 26.57 kg/m.   General: The patient is well-developed and well-nourished and in no acute distress  HEENT:  Head is [place]/AT.  Sclera are anicteric.     Skin: Extremities are without rash or  edema.    Neurologic Exam  Mental status: The patient is alert and oriented x 3 at the time of the examination. The patient has apparent normal recent and remote memory, with an apparently normal attention span and concentration ability.   Speech is normal.  Cranial nerves: Extraocular movements are full.    Facial symmetry is present. There is good facial sensation to soft touch bilaterally.Facial strength is normal.  Trapezius and sternocleidomastoid strength is normal. No dysarthria is noted.  The tongue is midline, and the patient has symmetric elevation of the soft palate. No obvious hearing deficits are noted.  Motor:  Muscle bulk is normal.   Tone is normal. Strength is  5 / 5 in all 4 extremities.   Sensory: Sensory testing is intact to  soft touch and vibration sensation     Gait and station: Station is normal.   Gait is normal.  Tandem gait is mildly wide, likely normal for age  Reflexes: Deep tendon reflexes are symmetric and normal bilaterally.      DIAGNOSTIC DATA (LABS, IMAGING, TESTING) - I reviewed patient records, labs, notes, testing and imaging myself where available.  Lab Results  Component Value Date   WBC 7.3 12/18/2020   HGB 13.3 12/18/2020   HCT 38.8 12/18/2020   MCV 101.2 (H) 12/18/2020   PLT 254.0 12/18/2020      Component Value Date/Time   NA 140 12/18/2020 1426   K 3.8 12/18/2020 1426   CL 104 12/18/2020 1426   CO2 25 12/18/2020 1426   GLUCOSE 99 12/18/2020 1426   BUN 12 12/18/2020 1426   CREATININE  0.78 12/18/2020 1426   CALCIUM 9.7 12/18/2020 1426   PROT 7.7  12/18/2020 1426   ALBUMIN 4.4 12/18/2020 1426   AST 18 12/18/2020 1426   ALT 16 12/18/2020 1426   ALKPHOS 60 12/18/2020 1426   BILITOT 0.5 12/18/2020 1426   GFRNONAA >60 01/14/2020 1355   GFRAA >60 01/14/2020 1355     Lab Results  Component Value Date   TSH 2.14 12/18/2020       ASSESSMENT AND PLAN  Chronic idiopathic facial pain  Pain of nose  Dysesthesia  Etiology of her facial pain is not certain.  Characteristics are not typical of trigeminal neuralgia.  No evidence of TN compression so not a surgical candidate.    Continue oxcarbazepine  150 mg po tid (or can do 150-300) and duloxetine  30 mg daily Return in 6 months or sooner if there are new or worsening neurologic symptoms  This visit is part of a comprehensive longitudinal care medical relationship regarding the patients primary diagnosis of atypical facial pain and related concerns.   Marion Rosenberry A. Vear, MD, [facility] 07/13/2024, 3:20 PM Certified in Neurology, Clinical Neurophysiology, Sleep Medicine and Neuroimaging  [facility] [address] [place], [place] [address] [phone]

## 2024-08-05 DIAGNOSIS — M25562 Pain in left knee: Secondary | ICD-10-CM | POA: Diagnosis not present

## 2024-08-05 DIAGNOSIS — M25561 Pain in right knee: Secondary | ICD-10-CM | POA: Diagnosis not present

## 2024-08-05 DIAGNOSIS — M170 Bilateral primary osteoarthritis of knee: Secondary | ICD-10-CM | POA: Diagnosis not present

## 2024-08-05 DIAGNOSIS — M1711 Unilateral primary osteoarthritis, right knee: Secondary | ICD-10-CM | POA: Diagnosis not present

## 2024-08-05 DIAGNOSIS — M1712 Unilateral primary osteoarthritis, left knee: Secondary | ICD-10-CM | POA: Diagnosis not present

## 2024-08-16 ENCOUNTER — Telehealth: Payer: Self-pay | Admitting: Neurology

## 2024-08-16 NOTE — Telephone Encounter (Signed)
 Pt called and LVM stating that 5 days ago she got sick and is having terrible pain and pressure in her sinuses. She has been taking all sorts of medication and is still having issues. Please advise.

## 2024-08-17 ENCOUNTER — Other Ambulatory Visit: Payer: Self-pay | Admitting: Neurology

## 2024-08-17 MED ORDER — PREGABALIN 100 MG PO CAPS: 100.0000 mg | ORAL_CAPSULE | Freq: Two times a day (BID) | ORAL | 5 refills | Status: DC

## 2024-08-17 NOTE — Telephone Encounter (Signed)
 Pt called again stating that she is in a lot of pain and this is going on the 7th day and she is really needing advise on what she can do to alleviate the pain.

## 2024-08-20 ENCOUNTER — Telehealth: Payer: Self-pay | Admitting: Diagnostic Neuroimaging

## 2024-08-20 NOTE — Telephone Encounter (Signed)
 Patient called in for worsening pain x 10 days. Has not followed up with pain clinic referral. Will forward to Dr. Vear and Amy Lomax, NP  for review.   EDUARD FABIENE HANLON, MD 08/20/2024, 4:31 PM Certified in Neurology, Neurophysiology and Neuroimaging  [facility] [address], [address] [place], [place] [address] [phone]

## 2024-08-23 ENCOUNTER — Other Ambulatory Visit: Payer: Self-pay | Admitting: Neurology

## 2024-08-23 MED ORDER — TRAMADOL HCL 50 MG PO TABS: 50.0000 mg | ORAL_TABLET | Freq: Three times a day (TID) | ORAL | 1 refills | Status: AC | PRN

## 2024-08-23 NOTE — Telephone Encounter (Signed)
 Dr. Vear- I forwarded you mychart messages from pt last week. What do you recommend?

## 2024-08-26 ENCOUNTER — Ambulatory Visit: Admitting: Behavioral Health

## 2024-09-02 ENCOUNTER — Other Ambulatory Visit: Payer: Self-pay | Admitting: Neurology

## 2024-09-02 DIAGNOSIS — G500 Trigeminal neuralgia: Secondary | ICD-10-CM

## 2024-09-03 ENCOUNTER — Telehealth: Payer: Self-pay | Admitting: Neurology

## 2024-09-03 NOTE — Telephone Encounter (Signed)
 Referral to Pain Clinic faxed to  [facility]  [facility] Phone:[phone] Fax#[phone]

## 2024-09-29 ENCOUNTER — Other Ambulatory Visit: Payer: Self-pay | Admitting: Behavioral Health

## 2024-09-29 DIAGNOSIS — F411 Generalized anxiety disorder: Secondary | ICD-10-CM

## 2024-09-29 NOTE — Telephone Encounter (Signed)
 Scheduled f/u 12/11

## 2024-09-29 NOTE — Telephone Encounter (Signed)
 Pt has her last refill on file to be filled today but needs an apt for further refills.  Last apt 05/2024  Schedule with Redell for follow up

## 2024-10-14 ENCOUNTER — Ambulatory Visit: Admitting: Behavioral Health

## 2024-10-14 ENCOUNTER — Encounter: Payer: Self-pay | Admitting: Behavioral Health

## 2024-10-14 DIAGNOSIS — F411 Generalized anxiety disorder: Secondary | ICD-10-CM | POA: Diagnosis not present

## 2024-10-14 DIAGNOSIS — F331 Major depressive disorder, recurrent, moderate: Secondary | ICD-10-CM | POA: Diagnosis not present

## 2024-10-14 MED ORDER — DULOXETINE HCL 30 MG PO CPEP: 30.0000 mg | ORAL_CAPSULE | Freq: Two times a day (BID) | ORAL | 3 refills | Status: AC

## 2024-10-14 NOTE — Progress Notes (Signed)
 [facility]  Patient ID: Teresa Hopkins,  MRN: [id number]  PCP: Crecencio Chiquita POUR, FNP  Date of Evaluation: 10/14/2024 Time spent:30 minutes  Chief Complaint:  Chief Complaint   Depression; Anxiety; Follow-up; Medication Refill; Patient Education     HISTORY/CURRENT STATUS: HPI  [name], 73 year old female presents to this office for follow up and medication management.  Reports good stability since last visit.  She has no questions or concerns today.  Will be traveling to [place]  for the holidays.  I numerically rates her depression at 2/10, and anxiety at 2/10. She is requesting to stay the course and not adjust medications this visit.   Denies history of mania, reports no history of psychosis, no auditory or visual hallucinations or delirium.  Denies current SI or HI.     Past psychiatric medication trials: Ativan  Prozac  Gabapentin     Individual Medical History/ Review of Systems: Changes? :No   Allergies: Lactose intolerance (gi)  Current Medications: Current Medications[1] Medication Side Effects: none  Family Medical/ Social History: Changes? No  MENTAL HEALTH EXAM:  Blood pressure (!) 142/78, pulse 82, height 5' 6 (1.676 m), weight 168 lb (76.2 kg).Body mass index is 27.12 kg/m.  General Appearance: Casual, Neat, and Well Groomed  Eye Contact:  Good  Speech:  Clear and Coherent  Volume:  Normal  Mood:  NA  Affect:  Appropriate  Thought Process:  Coherent  Orientation:  Full (Time, Place, and Person)  Thought Content: Logical   Suicidal Thoughts:  No  Homicidal Thoughts:  No  Memory:  WNL  Judgement:  Good  Insight:  Good  Psychomotor Activity:  Normal  Concentration:  Concentration: Good  Recall:  Good  Fund of Knowledge: Good  Language: Good  Assets:  Desire for Improvement  ADL's:  Intact  Cognition: WNL  Prognosis:  Good    DIAGNOSES:    ICD-10-CM   1. Generalized anxiety disorder  F41.1 DULoxetine  (CYMBALTA ) 30 MG  capsule    2. Major depressive disorder, recurrent episode, moderate (HCC)  F33.1 DULoxetine  (CYMBALTA ) 30 MG capsule      Receiving Psychotherapy: No    RECOMMENDATIONS:  Greater than 50% of 30 min face to face time with patient was spent on counseling and coordination of care.  We discussed her significant improvement with anxiety and depression. She is happy with the progress so far. Smiling today and very positive.   Recommended she check her BP more frequently and keep a log.    We agreed today to: To continue Cymbalta   60 mg daily.  Take 30 mg capsule in the morning and 30 mg capsule in the evening. Continue Ativan  1 mg three times daily as needed for severe anxiety.  Will report side effects or worsening symptoms promptly Will follow-up in 6 months to reassess  Reviewed PDMP     Redell DELENA Pizza, NP     [1]  Current Outpatient Medications:    DULoxetine  (CYMBALTA ) 30 MG capsule, Take 1 capsule (30 mg total) by mouth 2 (two) times daily., Disp: 60 capsule, Rfl: 3   LORazepam  (ATIVAN ) 1 MG tablet, Take 1 tablet (1 mg total) by mouth every 8 (eight) hours as needed (anxiety)., Disp: 90 tablet, Rfl: 3   OXcarbazepine  (TRILEPTAL ) 150 MG tablet, Take 1 tablet (150 mg total) by mouth in the morning, at noon, and at bedtime., Disp: 90 tablet, Rfl: 11   pregabalin  (LYRICA ) 100 MG capsule, Take 1 capsule (100 mg total) by mouth 2 (two) times daily.,  Disp: 60 capsule, Rfl: 5   traMADol  (ULTRAM ) 50 MG tablet, Take 1 tablet (50 mg total) by mouth every 8 (eight) hours as needed., Disp: 90 tablet, Rfl: 1

## 2024-10-22 ENCOUNTER — Telehealth: Payer: Self-pay | Admitting: Pediatrics

## 2024-10-22 NOTE — Telephone Encounter (Signed)
 LF 11/26, RF due 12/24

## 2024-10-22 NOTE — Telephone Encounter (Signed)
 Teresa Hopkins  called asking for her ativan  to be refilled. She saw brian on 12/11. She leaves on Monday to go out of town. [facility] [place]

## 2024-10-25 ENCOUNTER — Other Ambulatory Visit: Payer: Self-pay

## 2024-10-25 DIAGNOSIS — F411 Generalized anxiety disorder: Secondary | ICD-10-CM

## 2024-10-25 MED ORDER — LORAZEPAM 1 MG PO TABS: 1.0000 mg | ORAL_TABLET | Freq: Three times a day (TID) | ORAL | 0 refills | Status: DC | PRN

## 2024-10-25 NOTE — Telephone Encounter (Signed)
 Pended for RF today.

## 2024-10-27 ENCOUNTER — Other Ambulatory Visit: Payer: Self-pay | Admitting: Adult Health

## 2024-10-27 DIAGNOSIS — F411 Generalized anxiety disorder: Secondary | ICD-10-CM

## 2024-11-07 ENCOUNTER — Other Ambulatory Visit: Payer: Self-pay

## 2024-11-07 ENCOUNTER — Ambulatory Visit
Admission: RE | Admit: 2024-11-07 | Discharge: 2024-11-07 | Disposition: A | Discharge status: Home / Self Care | Source: Ambulatory Visit | Attending: Family Medicine | Admitting: Family Medicine

## 2024-11-07 VITALS — BP 127/84 | HR 86 | Temp 98.4°F | Resp 20 | Ht 67.0 in | Wt 165.0 lb

## 2024-11-07 DIAGNOSIS — J0100 Acute maxillary sinusitis, unspecified: Secondary | ICD-10-CM

## 2024-11-07 MED ORDER — AMOXICILLIN-POT CLAVULANATE 875-125 MG PO TABS: 1.0000 | ORAL_TABLET | Freq: Two times a day (BID) | ORAL | 0 refills | Status: AC

## 2024-11-07 NOTE — ED Provider Notes (Signed)
 " Teresa Hopkins    CSN: [id number] Arrival date & time: 11/07/24  1355      History   Chief Complaint Chief Complaint  Patient presents with   Nasal Congestion    Feels like a sinus infection.  Been congested for four days with sinus headache and pain. - Entered by patient    HPI Teresa Hopkins is a 74 y.o. female.   HPI Pleasant 74 year old female presents with sinus pain, congestion, facial pain and sneezing for 5 days.  Patient is accompanied by her husband who will also be evaluated today.  PMH significant for chronic facial and back pain, HLD, and trigeminal neuralgia  Past Medical History:  Diagnosis Date   Acid reflux    Anxiety    Chronic back pain    Chronic facial pain    COVID-19    Depression    Gastritis    Hyperlipidemia    Hyperlipidemia    Trigeminal neuralgia     Patient Active Problem List   Diagnosis Date Noted   Diarrhea 12/18/2020   Primary osteoarthritis of left hip 01/21/2020   HNP (herniated nucleus pulposus), lumbar 06/12/2015    Past Surgical History:  Procedure Laterality Date   ABDOMINAL HYSTERECTOMY     BACK SURGERY     microdiskectomy - 03/2015   BUNIONECTOMY Right    CATARACT EXTRACTION     fallopian tube and ovary surgery     LUMBAR LAMINECTOMY/DECOMPRESSION MICRODISCECTOMY N/A 06/12/2015   Procedure: redo L45 microdiskectomy;  Surgeon: Rockey Peru, MD;  Location: [facility];  Service: Neurosurgery;  Laterality: N/A;  redo L45 microdiskectomy   TOTAL HIP ARTHROPLASTY Left 01/21/2020   Procedure: TOTAL HIP ARTHROPLASTY ANTERIOR APPROACH;  Surgeon: Yvone Rush, MD;  Location: [facility];  Service: Orthopedics;  Laterality: Left;    OB History   No obstetric history on file.      Home Medications    Prior to Admission medications  Medication Sig Start Date End Date Taking? Authorizing Provider  amoxicillin -clavulanate (AUGMENTIN ) 875-125 MG tablet Take 1 tablet by mouth every 12 (twelve) hours. 11/07/24  Yes  Teddy Sharper, FNP  DULoxetine  (CYMBALTA ) 30 MG capsule Take 1 capsule (30 mg total) by mouth 2 (two) times daily. 10/14/24   Teresa Redell LABOR, NP  LORazepam  (ATIVAN ) 1 MG tablet Take 1 tablet (1 mg total) by mouth every 8 (eight) hours as needed (anxiety). 10/25/24   Mozingo, Regina Nattalie, NP  OXcarbazepine  (TRILEPTAL ) 150 MG tablet Take 1 tablet (150 mg total) by mouth in the morning, at noon, and at bedtime. 05/26/24   Sater, Charlie LABOR, MD  traMADol  (ULTRAM ) 50 MG tablet Take 1 tablet (50 mg total) by mouth every 8 (eight) hours as needed. 08/23/24   Sater, Charlie LABOR, MD    Family History Family History  Problem Relation Age of Onset   Dementia Mother    Alzheimer's disease Mother    Heart attack Father     Social History Social History[1]   Allergies   Lactose intolerance (gi)   Review of Systems Review of Systems  HENT:  Positive for congestion, sinus pressure and sinus pain.   All other systems reviewed and are negative.    Physical Exam Triage Vital Signs ED Triage Vitals  Encounter Vitals Group     BP      Girls Systolic BP Percentile      Girls Diastolic BP Percentile      Boys Systolic BP Percentile  Boys Diastolic BP Percentile      Pulse      Resp      Temp      Temp src      SpO2      Weight      Height      Head Circumference      Peak Flow      Pain Score      Pain Loc      Pain Education      Exclude from Growth Chart    No data found.  Updated Vital Signs BP 127/84 (BP Location: Right Arm)   Pulse 86   Temp 98.4 F (36.9 C) (Oral)   Resp 20   Ht 5' 7 (1.702 m)   Wt 165 lb (74.8 kg)   SpO2 95%   BMI 25.84 kg/m   Physical Exam Vitals and nursing note reviewed.  Constitutional:      Appearance: Normal appearance. She is normal weight. She is ill-appearing.  HENT:     Head: Normocephalic and atraumatic.     Right Ear: Tympanic membrane and external ear normal.     Left Ear: Tympanic membrane and external ear normal.      Ears:     Comments: Moderate eustachian tube dysfunction noted bilaterally    Mouth/Throat:     Mouth: Mucous membranes are moist.     Pharynx: Oropharynx is clear.  Eyes:     Extraocular Movements: Extraocular movements intact.     Conjunctiva/sclera: Conjunctivae normal.     Pupils: Pupils are equal, round, and reactive to light.  Cardiovascular:     Rate and Rhythm: Normal rate and regular rhythm.     Heart sounds: Normal heart sounds.  Pulmonary:     Effort: Pulmonary effort is normal.     Breath sounds: Normal breath sounds. No wheezing, rhonchi or rales.  Musculoskeletal:        General: Normal range of motion.  Skin:    General: Skin is warm.  Neurological:     General: No focal deficit present.     Mental Status: She is alert and oriented to person, place, and time. Mental status is at baseline.  Psychiatric:        Mood and Affect: Mood normal.        Behavior: Behavior normal.      UC Treatments / Results  Labs (all labs ordered are listed, but only abnormal results are displayed) Labs Reviewed - No data to display  EKG   Radiology No results found.  Procedures Procedures (including critical Hopkins time)  Medications Ordered in UC Medications - No data to display  Initial Impression / Assessment and Plan / UC Course  I have reviewed the triage vital signs and the nursing notes.  Pertinent labs & imaging results that were available during my Hopkins of the patient were reviewed by me and considered in my medical decision making (see chart for details).     MDM: 1.  Acute nonrecurrent maxillary sinusitis-Rx'd Augmentin  875/125 mg tablet: Take 1 tablet twice daily x 7 days. Advised patient take medication as directed with food to completion.  Encouraged increase daily water  intake to 64 ounces per day while taking this medication.  Advised if symptoms worsen and are unresolved please follow-up with your PCP or here for further evaluation.  Final Clinical  Impressions(s) / UC Diagnoses   Final diagnoses:  Acute non-recurrent maxillary sinusitis     Discharge Instructions  Advised patient take medication as directed with food to completion.  Encouraged increase daily water  intake to 64 ounces per day while taking this medication.  Advised if symptoms worsen and are unresolved please follow-up with your PCP or here for further evaluation.     ED Prescriptions     Medication Sig Dispense Auth. Provider   amoxicillin -clavulanate (AUGMENTIN ) 875-125 MG tablet Take 1 tablet by mouth every 12 (twelve) hours. 14 tablet Addisson Frate, FNP      PDMP not reviewed this encounter.    [1]  Social History Tobacco Use   Smoking status: Former    Current packs/day: 0.00    Types: Cigarettes    Quit date: 06/08/2000    Years since quitting: 24.4   Smokeless tobacco: Never  Vaping Use   Vaping status: Never Used  Substance Use Topics   Alcohol use: Yes    Comment: occasional   Drug use: No     Teddy Sharper, FNP 11/07/24 1451  "

## 2024-11-07 NOTE — ED Triage Notes (Signed)
 Pt presenting with c/o sinus pain, congestion, facial pain and sneezing  x 5 days. Pt stated that she used  saline, nasal spray, sudafed and Tylenol  with minimal effectiveness.

## 2024-11-07 NOTE — Discharge Instructions (Addendum)
 Advised patient take medication as directed with food to completion.  Encouraged increase daily water intake to 64 ounces per day while taking this medication.  Advised if symptoms worsen and are unresolved please follow-up with your PCP or here for further evaluation.

## 2024-11-26 ENCOUNTER — Other Ambulatory Visit: Payer: Self-pay | Admitting: Adult Health

## 2024-11-26 DIAGNOSIS — F411 Generalized anxiety disorder: Secondary | ICD-10-CM

## 2025-04-14 ENCOUNTER — Ambulatory Visit: Admitting: Behavioral Health
